# Patient Record
Sex: Male | Born: 1995 | Race: White | Hispanic: No | Marital: Married | Smoking: Former smoker
Health system: Southern US, Community
[De-identification: ages and names within clinical notes are randomized; demographics above are authoritative.]

## PROBLEM LIST (undated history)

## (undated) DIAGNOSIS — F319 Bipolar disorder, unspecified: Secondary | ICD-10-CM

## (undated) DIAGNOSIS — F259 Schizoaffective disorder, unspecified: Secondary | ICD-10-CM

## (undated) HISTORY — PX: WISDOM TOOTH EXTRACTION: SHX21

---
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
## 2015-09-26 DIAGNOSIS — Z79899 Other long term (current) drug therapy: Secondary | ICD-10-CM

## 2015-09-26 HISTORY — DX: Other long term (current) drug therapy: Z79.899

## 2015-10-23 DIAGNOSIS — M2550 Pain in unspecified joint: Secondary | ICD-10-CM

## 2015-10-23 HISTORY — DX: Pain in unspecified joint: M25.50

## 2015-10-26 DIAGNOSIS — R7689 Other specified abnormal immunological findings in serum: Secondary | ICD-10-CM

## 2015-10-26 DIAGNOSIS — R768 Other specified abnormal immunological findings in serum: Secondary | ICD-10-CM

## 2015-10-26 DIAGNOSIS — E559 Vitamin D deficiency, unspecified: Secondary | ICD-10-CM

## 2015-10-26 HISTORY — DX: Other specified abnormal immunological findings in serum: R76.89

## 2015-10-26 HISTORY — DX: Vitamin D deficiency, unspecified: E55.9

## 2015-10-26 HISTORY — DX: Other specified abnormal immunological findings in serum: R76.8

## 2015-11-17 DIAGNOSIS — R5382 Chronic fatigue, unspecified: Secondary | ICD-10-CM

## 2015-11-17 HISTORY — DX: Chronic fatigue, unspecified: R53.82

## 2015-12-07 DIAGNOSIS — J302 Other seasonal allergic rhinitis: Secondary | ICD-10-CM | POA: Insufficient documentation

## 2016-09-17 DIAGNOSIS — G4710 Hypersomnia, unspecified: Secondary | ICD-10-CM

## 2016-09-17 DIAGNOSIS — R109 Unspecified abdominal pain: Secondary | ICD-10-CM

## 2016-09-17 HISTORY — DX: Hypersomnia, unspecified: G47.10

## 2016-09-17 HISTORY — DX: Unspecified abdominal pain: R10.9

## 2017-03-19 HISTORY — PX: CHOLECYSTECTOMY: SHX55

## 2017-05-23 DIAGNOSIS — F419 Anxiety disorder, unspecified: Secondary | ICD-10-CM

## 2017-05-23 DIAGNOSIS — F429 Obsessive-compulsive disorder, unspecified: Secondary | ICD-10-CM

## 2017-05-23 HISTORY — DX: Obsessive-compulsive disorder, unspecified: F42.9

## 2017-05-23 HISTORY — DX: Anxiety disorder, unspecified: F41.9

## 2017-06-25 ENCOUNTER — Other Ambulatory Visit: Payer: Self-pay

## 2017-06-25 ENCOUNTER — Emergency Department (INDEPENDENT_AMBULATORY_CARE_PROVIDER_SITE_OTHER)
Admission: EM | Admit: 2017-06-25 | Discharge: 2017-06-25 | Disposition: A | Discharge status: Home / Self Care | Payer: PRIVATE HEALTH INSURANCE | Source: Home / Self Care | Attending: Family Medicine | Admitting: Family Medicine

## 2017-06-25 ENCOUNTER — Encounter: Payer: Self-pay | Admitting: *Deleted

## 2017-06-25 DIAGNOSIS — K219 Gastro-esophageal reflux disease without esophagitis: Secondary | ICD-10-CM | POA: Diagnosis not present

## 2017-06-25 HISTORY — DX: Schizoaffective disorder, unspecified: F25.9

## 2017-06-25 HISTORY — DX: Bipolar disorder, unspecified: F31.9

## 2017-06-25 LAB — POCT URINALYSIS DIP (MANUAL ENTRY)
Bilirubin, UA: NEGATIVE
GLUCOSE UA: NEGATIVE mg/dL
Ketones, POC UA: NEGATIVE mg/dL
Leukocytes, UA: NEGATIVE
NITRITE UA: NEGATIVE
PROTEIN UA: NEGATIVE mg/dL
RBC UA: NEGATIVE
Spec Grav, UA: 1.01 (ref 1.010–1.025)
UROBILINOGEN UA: 0.2 U/dL
pH, UA: 6 (ref 5.0–8.0)

## 2017-06-25 MED ORDER — OMEPRAZOLE 40 MG PO CPDR: DELAYED_RELEASE_CAPSULE | ORAL | 1 refills | Status: DC

## 2017-06-25 NOTE — ED Triage Notes (Signed)
Pt c/o nausea mostly after eating, lower abd cramping, cough and sinus drainage x 1 wk. He also reports feeling lethargy. He as vomited 2 x and had minimal diarrhea.

## 2017-06-25 NOTE — Discharge Instructions (Addendum)
If symptoms become significantly worse during the night or over the weekend, proceed to the local emergency room.  

## 2017-06-25 NOTE — ED Provider Notes (Signed)
[facility]    CSN: [id number] Arrival date & time: 06/25/17  1903     History   Chief Complaint Chief Complaint  Patient presents with  . Nausea  . Abdominal Cramping    HPI Joshua Tyler is a 22 y.o. male.   Patient complains of approximately one week history of intermittent nausea and upper abdominal discomfort after eating.  He has had two episodes of vomiting.  His symptoms are not triggered by specific foods.  His bowel movements have generally been normal except for an episode of loose stools.  He states that he is recovering from a mild cold.  He states that he has been fatigued, but denies fevers, chills, and sweats.  No urinary symptoms. He admits that he has had frequent indigestion for several weeks prior to his present symptoms.  He also reports that he has been under increased stress recently  The history is provided by the patient.    Past Medical History:  Diagnosis Date  . Bipolar 1 disorder (HCC)   . Schizoaffective disorder (HCC)     There are no active problems to display for this patient.   Past Surgical History:  Procedure Laterality Date  . WISDOM TOOTH EXTRACTION         Home Medications    Prior to Admission medications   Medication Sig Start Date End Date Taking? Authorizing Provider  ARIPiprazole (ABILIFY) 5 MG tablet Take by mouth. 08/20/16  Yes [provider]  buPROPion (WELLBUTRIN XL) 150 MG 24 hr tablet Take by mouth. 06/03/17 06/03/18 Yes [provider]  docusate sodium (COLACE) 100 MG capsule Take 100 mg by mouth 2 (two) times daily.   Yes [provider]  lamoTRIgine (LAMICTAL) 200 MG tablet Take by mouth. 04/12/17 04/12/18 Yes [provider]  loratadine (CLARITIN) 10 MG tablet Take by mouth. 11/20/16 11/20/17 Yes [provider]  LORazepam (ATIVAN) 0.5 MG tablet Take 1 tab each morning and night, and 1 additional tablet as needed for anxiety / panic 10/07/15  Yes [provider]  propranolol (INDERAL) 20 MG tablet Take by mouth. 08/07/16  Yes [provider]  cloZAPine (CLOZARIL) 100 MG tablet TAKE 4 TABLETS (400 MG TOTAL) BY MOUTH NIGHTLY. 05/20/17   [provider]  cloZAPine (CLOZARIL) 25 MG tablet TAKE 3 TABLETS (75 MG TOTAL) BY MOUTH NIGHTLY. TAKE ALONG WITH 3 X 100MG  TABLETS 04/19/17   [provider]  omeprazole (PRILOSEC) 40 MG capsule Take one cap by mouth once daily about 20 minutes before food 06/25/17   Lattie Haw, MD    Family History Family History  Problem Relation Age of Onset  . Heart disease Father     Social History Social History   Tobacco Use  . Smoking status: Former Smoker    Last attempt to quit: 06/25/2012    Years since quitting: 5.0  . Smokeless tobacco: Never Used  Substance Use Topics  . Alcohol use: Never    Frequency: Never  . Drug use: Never     Allergies   Patient has no known allergies.   Review of Systems Review of Systems  Constitutional: Positive for appetite change and fatigue. Negative for chills, diaphoresis, fever and unexpected weight change.  HENT: Positive for congestion.   Eyes: Negative.   Respiratory: Positive for cough.   Cardiovascular: Negative for chest pain.  Gastrointestinal: Positive for abdominal pain, constipation, diarrhea, nausea and vomiting. Negative for abdominal distention, anal bleeding and blood in stool.  Genitourinary: Negative.   Musculoskeletal: Negative.   Skin: Negative.   Neurological: Negative for dizziness and headaches.     Physical Exam Triage Vital Signs ED Triage Vitals  Enc Vitals Group     BP 06/25/17 2007 109/75     Pulse Rate 06/25/17 2007 90     Resp 06/25/17 2007 18     Temp 06/25/17 2007 98.5 F (36.9 C)     Temp Source 06/25/17 2007 Oral     SpO2 06/25/17 2007 98 %     Weight 06/25/17 2008 207 lb (93.9 kg)     Height 06/25/17 2008 6' (1.829 m)     Head Circumference --      Peak Flow --      Pain Score 06/25/17 2007 5       Pain Loc --      Pain Edu? --      Excl. in GC? --    No data found.  Updated Vital Signs BP 109/75 (BP Location: Right Arm)   Pulse 90   Temp 98.5 F (36.9 C) (Oral)   Resp 18   Ht 6' (1.829 m)   Wt 207 lb (93.9 kg)   SpO2 98%   BMI 28.07 kg/m   Visual Acuity Right Eye Distance:   Left Eye Distance:   Bilateral Distance:    Right Eye Near:   Left Eye Near:    Bilateral Near:     Physical Exam Nursing notes and Vital Signs reviewed. Appearance:  Patient appears stated age, and in no acute distress.    Eyes:  Pupils are equal, round, and reactive to light and accomodation.  Extraocular movement is intact.  Conjunctivae are not inflamed. Ears:  Tympanic membranes normal  Nose:  Normal.  Pharynx:  Normal; moist mucous membranes  Neck:  Supple.  No adenopathy or thyromegaly Lungs:  Clear to auscultation.  Breath sounds are equal.  Moving air well. Heart:  Regular rate and rhythm without murmurs, rubs, or gallops.  Abdomen:  Mild tenderness subxiphoid and epigastric area without masses or hepatosplenomegaly.  Bowel sounds are present.   There is mild bilateral flank tenderness.  Extremities:  No edema.  Skin:  No rash present.     UC Treatments / Results  Labs (all labs ordered are listed, but only abnormal results are displayed) Labs Reviewed  POCT URINALYSIS DIP (MANUAL ENTRY) negative    EKG None Radiology No results found.  Procedures Procedures (including critical care time)  Medications Ordered in UC Medications - No data to display   Initial Impression / Assessment and Plan / UC Course  I have reviewed the triage vital signs and the nursing notes.  Pertinent labs & imaging results that were available during my care of the patient were reviewed by me and considered in my medical decision making (see chart for details).    Begin omeprazole 40mg  daily Followup with PCP; may need GI referral if not improving. If symptoms become significantly  worse during the night or over the weekend, proceed to the local emergency room.     Final Clinical Impressions(s) / UC Diagnoses   Final diagnoses:  Gastroesophageal reflux disease, esophagitis presence not specified    ED Discharge Orders        Ordered    omeprazole (PRILOSEC) 40 MG capsule     06/25/17 2112         Lattie HawBeese, Stephen A, MD 06/26/17 1216

## 2017-07-19 ENCOUNTER — Encounter: Payer: Self-pay | Admitting: Physician Assistant

## 2017-07-19 ENCOUNTER — Ambulatory Visit (INDEPENDENT_AMBULATORY_CARE_PROVIDER_SITE_OTHER): Payer: PRIVATE HEALTH INSURANCE | Admitting: Physician Assistant

## 2017-07-19 VITALS — BP 104/69 | HR 86 | Ht 72.0 in | Wt 193.0 lb

## 2017-07-19 DIAGNOSIS — K2100 Gastro-esophageal reflux disease with esophagitis, without bleeding: Secondary | ICD-10-CM

## 2017-07-19 DIAGNOSIS — Z79899 Other long term (current) drug therapy: Secondary | ICD-10-CM | POA: Diagnosis not present

## 2017-07-19 DIAGNOSIS — K2900 Acute gastritis without bleeding: Secondary | ICD-10-CM | POA: Diagnosis not present

## 2017-07-19 MED ORDER — PANTOPRAZOLE SODIUM 40 MG PO TBEC: 40.0000 mg | DELAYED_RELEASE_TABLET | Freq: Two times a day (BID) | ORAL | 1 refills | Status: DC

## 2017-07-19 MED ORDER — SUCRALFATE 1 G PO TABS: 1.0000 g | ORAL_TABLET | Freq: Four times a day (QID) | ORAL | 1 refills | Status: DC

## 2017-07-19 NOTE — Progress Notes (Signed)
   Subjective:    Patient ID: Joshua Tyler, male    DOB: February 16, 1996, 22 y.o.   MRN: [id number]  HPI  Pt is a 22 yo male who presents to the clinic to establish care.   .. Active Ambulatory Problems    Diagnosis Date Noted  . Bipolar 1 disorder (HCC) 07/21/2017  . Schizoaffective disorder (HCC) 07/21/2017  . Gastroesophageal reflux disease with esophagitis 07/21/2017   Resolved Ambulatory Problems    Diagnosis Date Noted  . No Resolved Ambulatory Problems   Past Medical History:  Diagnosis Date  . Bipolar 1 disorder (HCC)   . Schizoaffective disorder (HCC)    Pt is concerned about 2 months of nausea, acid reflux and epigastric pain. He has also lost 20lbs in the last month because he can't stand to eat. Eating seems to make worse. He has vomited once. He denies any bowel changes. No melena or hematochezia. He reports being tested via stool for h.pylori that was negative. He has not started any new medications. He does eat a lot of hot sauce before this excessively. On protonix with little relief.       Review of Systems  All other systems reviewed and are negative.      Objective:   Physical Exam  Constitutional: He is oriented to person, place, and time. He appears well-developed and well-nourished.  HENT:  Head: Normocephalic and atraumatic.  Cardiovascular: Normal rate and regular rhythm.  Pulmonary/Chest: Effort normal and breath sounds normal.  Abdominal: Soft. Bowel sounds are normal. He exhibits no distension and no mass. There is tenderness. There is no rebound and no guarding.  Epigastric tenderness.   Neurological: He is alert and oriented to person, place, and time.  Psychiatric: He has a normal mood and affect. His behavior is normal.          Assessment & Plan:  Marland KitchenMarland KitchenAhmari was seen today for establish care and gastroesophageal reflux.  Diagnoses and all orders for this visit:  Gastroesophageal reflux disease with esophagitis -     CBC with  Differential/Platelet -     COMPLETE METABOLIC PANEL WITH GFR -     Lipase -     sucralfate (CARAFATE) 1 g tablet; Take 1 tablet (1 g total) by mouth 4 (four) times daily. -     pantoprazole (PROTONIX) 40 MG tablet; Take 1 tablet (40 mg total) by mouth 2 (two) times daily before a meal.  Medication management -     CBC with Differential/Platelet  Acute gastritis without hemorrhage, unspecified gastritis type -     CBC with Differential/Platelet -     COMPLETE METABOLIC PANEL WITH GFR -     Lipase -     sucralfate (CARAFATE) 1 g tablet; Take 1 tablet (1 g total) by mouth 4 (four) times daily. -     pantoprazole (PROTONIX) 40 MG tablet; Take 1 tablet (40 mg total) by mouth 2 (two) times daily before a meal.  labs ordered.  Seems like GERD with some gastritis vs PUD. Unclear cause but could be hot sauce. Discussed BRAT diet. GI cocktail given today. Increase protonix bid and added carafate. Follow up in 4 weeks.

## 2017-07-19 NOTE — Patient Instructions (Signed)
Peptic Ulcer °A peptic ulcer is a painful sore in the lining of your esophagus, stomach, or the first part of your small intestine. You may have pain in the area between your chest and your belly button. The most common causes of an ulcer are: °· An infection. °· Using certain pain medicines too often or too much. ° °Follow these instructions at home: °· Avoid alcohol. °· Avoid caffeine. °· Do not use any tobacco products. These include cigarettes, chewing tobacco, and e-cigarettes. If you need help quitting, ask your doctor. °· Take over-the-counter and prescription medicines only as told by your doctor. Do not stop or change your medicines unless you talk with your doctor about it first. °· Keep all follow-up visits as told by your doctor. This is important. °Contact a doctor if: °· You do not get better in 7 days after you start treatment. °· You keep having an upset stomach (indigestion) or heartburn. °Get help right away if: °· You have sudden, sharp pain in your belly (abdomen). °· You have lasting belly pain. °· You have bloody poop (stool) or black, tarry poop. °· You throw up (vomit) blood. It may look like coffee grounds. °· You feel light-headed or feel like you may pass out (faint). °· You get weak. °· You get sweaty or feel sticky and cold to the touch (clammy). °This information is not intended to replace advice given to you by your health care provider. Make sure you discuss any questions you have with your health care provider. °Document Released: 05/30/2009 Document Revised: 07/20/2015 Document Reviewed: 12/04/2014 °Elsevier Interactive Patient Education © 2018 Elsevier Inc[employer] ° °

## 2017-07-21 ENCOUNTER — Encounter: Payer: Self-pay | Admitting: Physician Assistant

## 2017-07-21 DIAGNOSIS — K2100 Gastro-esophageal reflux disease with esophagitis, without bleeding: Secondary | ICD-10-CM | POA: Insufficient documentation

## 2017-07-21 DIAGNOSIS — F259 Schizoaffective disorder, unspecified: Secondary | ICD-10-CM | POA: Insufficient documentation

## 2017-07-21 DIAGNOSIS — F319 Bipolar disorder, unspecified: Secondary | ICD-10-CM | POA: Insufficient documentation

## 2017-07-21 HISTORY — DX: Schizoaffective disorder, unspecified: F25.9

## 2017-07-21 HISTORY — DX: Gastro-esophageal reflux disease with esophagitis, without bleeding: K21.00

## 2017-07-23 LAB — CBC WITH DIFFERENTIAL/PLATELET
BASOS ABS: 22 {cells}/uL (ref 0–200)
BASOS PCT: 0.3 %
EOS PCT: 0 %
Eosinophils Absolute: 0 cells/uL — ABNORMAL LOW (ref 15–500)
HCT: 44.1 % (ref 38.5–50.0)
Hemoglobin: 15.5 g/dL (ref 13.2–17.1)
Lymphs Abs: 1368 cells/uL (ref 850–3900)
MCH: 30.3 pg (ref 27.0–33.0)
MCHC: 35.1 g/dL (ref 32.0–36.0)
MCV: 86.3 fL (ref 80.0–100.0)
MONOS PCT: 6.7 %
MPV: 10.7 fL (ref 7.5–12.5)
NEUTROS ABS: 5328 {cells}/uL (ref 1500–7800)
Neutrophils Relative %: 74 %
PLATELETS: 225 10*3/uL (ref 140–400)
RBC: 5.11 10*6/uL (ref 4.20–5.80)
RDW: 12.9 % (ref 11.0–15.0)
TOTAL LYMPHOCYTE: 19 %
WBC mixed population: 482 cells/uL (ref 200–950)
WBC: 7.2 10*3/uL (ref 3.8–10.8)

## 2017-07-23 LAB — COMPLETE METABOLIC PANEL WITH GFR
AG Ratio: 2 (calc) (ref 1.0–2.5)
ALKALINE PHOSPHATASE (APISO): 93 U/L (ref 40–115)
ALT: 10 U/L (ref 9–46)
AST: 12 U/L (ref 10–40)
Albumin: 4.7 g/dL (ref 3.6–5.1)
BUN: 13 mg/dL (ref 7–25)
CO2: 30 mmol/L (ref 20–32)
CREATININE: 1.25 mg/dL (ref 0.60–1.35)
Calcium: 9.7 mg/dL (ref 8.6–10.3)
Chloride: 104 mmol/L (ref 98–110)
GFR, Est African American: 94 mL/min/{1.73_m2} (ref >=60)
GFR, Est Non African American: 81 mL/min/{1.73_m2} (ref >=60)
Globulin: 2.3 g/dL (calc) (ref 1.9–3.7)
Glucose, Bld: 89 mg/dL (ref 65–99)
Potassium: 4.1 mmol/L (ref 3.5–5.3)
Sodium: 139 mmol/L (ref 135–146)
Total Bilirubin: 0.9 mg/dL (ref 0.2–1.2)
Total Protein: 7 g/dL (ref 6.1–8.1)

## 2017-07-23 LAB — LIPASE: LIPASE: 29 U/L (ref 7–60)

## 2017-07-23 NOTE — Progress Notes (Signed)
Call pt: labs look great. Nothing concerning.

## 2017-07-23 NOTE — Progress Notes (Signed)
We could but then he would have to be off PPI/carafate for at least 2 weeks. I suggest giving medication therapy a try and then consider endoscopy if symptoms persist.

## 2017-07-25 ENCOUNTER — Telehealth: Payer: Self-pay

## 2017-07-25 DIAGNOSIS — R1013 Epigastric pain: Secondary | ICD-10-CM

## 2017-07-25 NOTE — Telephone Encounter (Signed)
Joshua Tyler's wife called and reports the medication treatment is not helping. He still is very nauseated and has a lot of gas. She states he is still not able to eat. She is requesting to go ahead with the ultra sound. I didn't see anything about an ultra sound in the note. She is also wanting him to have IV fluids.

## 2017-07-25 NOTE — Telephone Encounter (Signed)
Ok for abdominal ultrasound for epigastric pain, nausea.

## 2017-07-26 NOTE — Telephone Encounter (Signed)
US ordered

## 2017-07-30 ENCOUNTER — Encounter: Payer: Self-pay | Admitting: Physician Assistant

## 2017-07-30 ENCOUNTER — Ambulatory Visit (INDEPENDENT_AMBULATORY_CARE_PROVIDER_SITE_OTHER): Payer: PRIVATE HEALTH INSURANCE

## 2017-07-30 DIAGNOSIS — R161 Splenomegaly, not elsewhere classified: Secondary | ICD-10-CM | POA: Diagnosis not present

## 2017-07-30 DIAGNOSIS — R1013 Epigastric pain: Secondary | ICD-10-CM

## 2017-07-30 HISTORY — DX: Splenomegaly, not elsewhere classified: R16.1

## 2017-07-30 NOTE — Progress Notes (Signed)
Call pt: spleen is enlarged. Certainly needs to be discussed with GI. There is a lot of gas. When is appt with GI? No sign of anything going on with gallbladder.

## 2017-07-31 ENCOUNTER — Telehealth: Payer: Self-pay | Admitting: Emergency Medicine

## 2017-07-31 ENCOUNTER — Ambulatory Visit (INDEPENDENT_AMBULATORY_CARE_PROVIDER_SITE_OTHER): Payer: PRIVATE HEALTH INSURANCE | Admitting: Physician Assistant

## 2017-07-31 ENCOUNTER — Encounter: Payer: Self-pay | Admitting: Physician Assistant

## 2017-07-31 ENCOUNTER — Ambulatory Visit (INDEPENDENT_AMBULATORY_CARE_PROVIDER_SITE_OTHER): Payer: PRIVATE HEALTH INSURANCE

## 2017-07-31 VITALS — BP 124/82 | HR 105 | Temp 98.3°F | Resp 14 | Wt 189.0 lb

## 2017-07-31 DIAGNOSIS — R000 Tachycardia, unspecified: Secondary | ICD-10-CM | POA: Diagnosis not present

## 2017-07-31 DIAGNOSIS — R1114 Bilious vomiting: Secondary | ICD-10-CM

## 2017-07-31 DIAGNOSIS — R509 Fever, unspecified: Secondary | ICD-10-CM

## 2017-07-31 DIAGNOSIS — R161 Splenomegaly, not elsewhere classified: Secondary | ICD-10-CM | POA: Diagnosis not present

## 2017-07-31 DIAGNOSIS — R05 Cough: Secondary | ICD-10-CM | POA: Diagnosis not present

## 2017-07-31 DIAGNOSIS — E860 Dehydration: Secondary | ICD-10-CM | POA: Diagnosis not present

## 2017-07-31 DIAGNOSIS — J989 Respiratory disorder, unspecified: Secondary | ICD-10-CM

## 2017-07-31 LAB — POCT RAPID STREP A (OFFICE): RAPID STREP A SCREEN: NEGATIVE

## 2017-07-31 MED ORDER — ONDANSETRON HCL 4 MG/2ML IJ SOLN
4.0000 mg | Freq: Once | INTRAMUSCULAR | Status: AC
  Administered 2017-07-31: 4 mg via INTRAVENOUS

## 2017-07-31 MED ORDER — BENZONATATE 200 MG PO CAPS: 200.0000 mg | ORAL_CAPSULE | Freq: Three times a day (TID) | ORAL | 0 refills | Status: DC | PRN

## 2017-07-31 MED ORDER — SODIUM CHLORIDE 0.9 % IV SOLN: 4.0000 mg | Freq: Once | INTRAVENOUS | Status: DC

## 2017-07-31 MED ORDER — LIDOCAINE VISCOUS HCL 2 % MT SOLN: 10.0000 mL | OROMUCOSAL | 0 refills | Status: AC | PRN

## 2017-07-31 MED ORDER — ONDANSETRON 4 MG PO TBDP: 4.0000 mg | ORAL_TABLET | Freq: Three times a day (TID) | ORAL | 0 refills | Status: DC | PRN

## 2017-07-31 NOTE — Patient Instructions (Addendum)
For sore throat: - Tylenol  every 8 hours as needed for throat pain. Alternate with Ibuprofen  every 6 hours as needed for pain/fever - gargle and spit lidocaine every 3 hours as needed - Cepacol throat lozenges - Warm salt water gargles  For cough: - Mucinex with at least 8 oz. of water to make cough more productive - Delsym to help suppress cough - Tessalon pearls: 1 capsule every 8 hours as needed  For elevated heart rate: - push fluids (at least 1.5L of clear liquids per day) -  For nausea: - zofran dissolved under the tongue every 8 hours as needed for nausea  - take small sips every 15 minutes - bland diet

## 2017-07-31 NOTE — Progress Notes (Signed)
HPI:                                                                Joshua Tyler is a 22 y.o. male who presents to [facility]: Primary Care Sports Medicine today for  sore throat and cough  URI   This is a new problem. The current episode started in the past 7 days. The problem has been unchanged. Maximum temperature: tactile fever. The fever has been present for less than 1 day. Associated symptoms include abdominal pain (chronic epigastric), congestion, coughing, ear pain, nausea, a sore throat and vomiting. Pertinent negatives include no chest pain, neck pain, rash or wheezing. He has tried acetaminophen for the symptoms.     Past Medical History:  Diagnosis Date  . Bipolar 1 disorder (HCC)   . Schizoaffective disorder [facility])    Past Surgical History:  Procedure Laterality Date  . WISDOM TOOTH EXTRACTION     Social History   Tobacco Use  . Smoking status: Former Smoker    Last attempt to quit: 06/25/2012    Years since quitting: 5.1  . Smokeless tobacco: Never Used  Substance Use Topics  . Alcohol use: Never    Frequency: Never   family history includes Cancer in his paternal grandfather; Heart attack in his paternal grandfather; Heart disease in his father.    ROS: negative except as noted in the HPI  Medications: Current Outpatient Medications  Medication Sig Dispense Refill  . ARIPiprazole (ABILIFY) 5 MG tablet Take by mouth.    . benzonatate (TESSALON) 200 MG capsule Take 1 capsule (200 mg total) by mouth 3 (three) times daily as needed for cough. 45 capsule 0  . buPROPion (WELLBUTRIN XL) 150 MG 24 hr tablet Take 450 mg by mouth.     . cloZAPine (CLOZARIL) 100 MG tablet TAKE 4 TABLETS (400 MG TOTAL) BY MOUTH NIGHTLY.  3  . cloZAPine (CLOZARIL) 25 MG tablet TAKE 1 TABLET BY MOUTH NIGHTLY. TAKE ALONG WITH 4  TABLETS  3  . docusate sodium (COLACE) 100 MG capsule Take 100 mg by mouth 2 (two) times daily.    [name] lamoTRIgine (LAMICTAL) 200 MG tablet  Take by mouth.    . lidocaine (XYLOCAINE) 2 % solution Use as directed 10 mLs in the mouth or throat every 3 (three) hours as needed for up to 7 days for mouth pain. 100 mL 0  . loratadine (CLARITIN) 10 MG tablet Take by mouth.    [name] LORazepam (ATIVAN) 0.5 MG tablet Take 1 tab each morning and night, and 1 additional tablet as needed for anxiety / panic    . ondansetron (ZOFRAN-ODT) 4 MG disintegrating tablet Take 1 tablet (4 mg total) by mouth every 8 (eight) hours as needed for nausea or vomiting. 16 tablet 0  . pantoprazole (PROTONIX) 40 MG tablet Take 1 tablet (40 mg total) by mouth 2 (two) times daily before a meal. 60 tablet 1  . propranolol (INDERAL) 20 MG tablet Take by mouth.    . sucralfate (CARAFATE) 1 g tablet Take 1 tablet (1 g total) by mouth 4 (four) times daily. 120 tablet 1   Current Facility-Administered Medications  Medication Dose Route Frequency Provider Last Rate Last Dose  . ondansetron (ZOFRAN) 4 mg in  sodium chloride 0.9 % 50 mL IVPB  4 mg Intravenous Once Carlis Stable, PA-C       No Known Allergies     Objective:  BP 124/82   Pulse (!) 125   Temp 98.3 F (36.8 C) (Oral)   Wt 189 lb (85.7 kg)   SpO2 97%   BMI 25.63 kg/m  Gen:  alert, ill-appearing, not toxic-appearing, no distress, appropriate for age HEENT: head normocephalic without obvious abnormality, conjunctiva and cornea clear, TM's pearly gray and semi-transparent, oropharynx with erythema, no exudates, uvula midline, neck supple, no cervical adenopathy, trachea midline Pulm: Normal work of breathing, normal phonation, clear to auscultation bilaterally, no wheezes, rales or rhonchi CV: tachycardic on arrival at 125, decreased to 105 on recheck, regular rhythm, s1 and s2 distinct, no murmurs, clicks or rubs  Neuro: alert and oriented x 3, no tremor MSK: extremities atraumatic, normal gait and station Skin: intact, no rashes on exposed skin, no jaundice, no cyanosis   No results found  for this or any previous visit (from the past 72 hour(s)). US Abdomen Complete  Result Date: 07/30/2017 CLINICAL DATA:  22 year old male with epigastric pain for 2 months. Initial encounter. EXAM: ABDOMEN ULTRASOUND COMPLETE COMPARISON:  None. FINDINGS: Gallbladder: No gallstones or wall thickening visualized. No sonographic Murphy sign noted by sonographer. Common bile duct: Diameter: 3.1 mm Liver: No focal lesion identified. Within normal limits in parenchymal echogenicity. Portal vein is patent on color Doppler imaging with normal direction of blood flow towards the liver. IVC: Not well visualized secondary to bowel gas. Pancreas: Not well visualized secondary to bowel gas. Spleen: Mild splenomegaly with spleen spanning over 13 cm and volume of 683 cc. Right Kidney: Length: 10.1 cm. Echogenicity within normal limits. No mass or hydronephrosis visualized. Left Kidney: Length: 12.6 cm. Echogenicity within normal limits. No mass or hydronephrosis visualized. Abdominal aorta: Suboptimally evaluated secondary to habitus and bowel gas. Portions visualized unremarkable. No aneurysm visualized. Other findings: None. IMPRESSION: Mild splenomegaly with spleen spanning over 13 cm and volume of 683 cc. Inferior vena cava, pancreas and portions of aorta poorly delineated secondary to bowel gas. Remainder of exam within normal limits. Specifically, no gallstones or findings to suggest gallbladder inflammation. Electronically Signed   By: Lacy Duverney M.D.   On: 07/30/2017 13:50    Procedure note: Inserted #20 gauge, 1-inch BD Insyte angiocatheter in the right antecubital vein on attempt. Good blood return. Flushed with normal saline.     Assessment and Plan: 22 y.o. male with   Febrile respiratory illness - Plan: Epstein-Barr virus VCA antibody panel, CBC with Differential/Platelet, POCT rapid strep A, lidocaine (XYLOCAINE) 2 % solution, benzonatate (TESSALON) 200 MG capsule, DG Chest 2 View  Splenomegaly -  Plan: Epstein-Barr virus VCA antibody panel, CBC with Differential/Platelet  Bilious vomiting with nausea - Plan: ondansetron (ZOFRAN-ODT) 4 MG disintegrating tablet, ondansetron (ZOFRAN) injection 4 mg, DISCONTINUED: ondansetron (ZOFRAN) 4 mg in sodium chloride 0.9 % 50 mL IVPB  2 days of tactile fevers, sore throat, cough, n/v. He was tachycardic at 125 bpm on arrival, afebrile, no tachypnea. Tachycardia improved to 105 after 1L of NS. Likely secondary to dehydration.    He is being worked up for chronic epigastric pain and nausea. Abdominal US showed incidental finding of splenomegaly. Infectious mono is on the differential. Rapid strep negative. CBC w/diff and EBV antibodies pending   He had one episode of emesis last night. Tolerating PO fluids. IV zofran given in office today along with 1L  of normal saline. Chest x-ray pending.  Counseled on symptomatic care for acute URI and nausea   Patient education and anticipatory guidance given Patient agrees with treatment plan Follow-up as needed if symptoms worsen or fail to improve  Levonne Hubert PA-C

## 2017-07-31 NOTE — Telephone Encounter (Incomplete)
Patient's wife calling with request from patient's psychiatrist that a monthly CBC be drawn and results faxed to : Attention Dr.Bhargava; fax [phone]. I am printing and faxing results 07/22/17 to get this started.

## 2017-08-01 LAB — CBC WITH DIFFERENTIAL/PLATELET
BASOS PCT: 0.4 %
Basophils Absolute: 20 cells/uL (ref 0–200)
Eosinophils Absolute: 0 cells/uL — ABNORMAL LOW (ref 15–500)
Eosinophils Relative: 0 %
HCT: 39.3 % (ref 38.5–50.0)
Hemoglobin: 13.8 g/dL (ref 13.2–17.1)
Lymphs Abs: 1030 cells/uL (ref 850–3900)
MCH: 30.2 pg (ref 27.0–33.0)
MCHC: 35.1 g/dL (ref 32.0–36.0)
MCV: 86 fL (ref 80.0–100.0)
MONOS PCT: 12.1 %
MPV: 11.1 fL (ref 7.5–12.5)
Neutro Abs: 3432 cells/uL (ref 1500–7800)
Neutrophils Relative %: 67.3 %
PLATELETS: 176 10*3/uL (ref 140–400)
RBC: 4.57 10*6/uL (ref 4.20–5.80)
RDW: 12.8 % (ref 11.0–15.0)
TOTAL LYMPHOCYTE: 20.2 %
WBC mixed population: 617 cells/uL (ref 200–950)
WBC: 5.1 10*3/uL (ref 3.8–10.8)

## 2017-08-01 LAB — EPSTEIN-BARR VIRUS VCA ANTIBODY PANEL
EBV NA IgG: <18 U/mL
EBV VCA IgG: <18 U/mL
EBV VCA IgM: <36 U/mL

## 2017-08-02 NOTE — Progress Notes (Signed)
Negative mono test Normal white blood cell counts

## 2017-08-04 ENCOUNTER — Encounter: Payer: Self-pay | Admitting: Physician Assistant

## 2017-08-06 ENCOUNTER — Other Ambulatory Visit: Payer: Self-pay | Admitting: Physician Assistant

## 2017-08-06 DIAGNOSIS — R1114 Bilious vomiting: Secondary | ICD-10-CM

## 2017-08-09 ENCOUNTER — Ambulatory Visit: Payer: PRIVATE HEALTH INSURANCE | Admitting: Sports Medicine

## 2017-08-16 ENCOUNTER — Encounter: Payer: Self-pay | Admitting: Physician Assistant

## 2017-09-24 ENCOUNTER — Encounter: Payer: Self-pay | Admitting: Physician Assistant

## 2017-09-24 DIAGNOSIS — Z79899 Other long term (current) drug therapy: Secondary | ICD-10-CM

## 2017-09-26 ENCOUNTER — Other Ambulatory Visit: Payer: Self-pay

## 2017-09-26 DIAGNOSIS — Z79899 Other long term (current) drug therapy: Secondary | ICD-10-CM

## 2017-09-26 LAB — CBC WITH DIFFERENTIAL/PLATELET
BASOS PCT: 0.3 %
Basophils Absolute: 19 cells/uL (ref 0–200)
EOS PCT: 0 %
Eosinophils Absolute: 0 cells/uL — ABNORMAL LOW (ref 15–500)
HCT: 45.1 % (ref 38.5–50.0)
HEMOGLOBIN: 15.6 g/dL (ref 13.2–17.1)
Lymphs Abs: 1336 cells/uL (ref 850–3900)
MCH: 30.7 pg (ref 27.0–33.0)
MCHC: 34.6 g/dL (ref 32.0–36.0)
MCV: 88.8 fL (ref 80.0–100.0)
MONOS PCT: 6.4 %
MPV: 10.7 fL (ref 7.5–12.5)
NEUTROS ABS: 4542 {cells}/uL (ref 1500–7800)
Neutrophils Relative %: 72.1 %
PLATELETS: 212 10*3/uL (ref 140–400)
RBC: 5.08 10*6/uL (ref 4.20–5.80)
RDW: 13 % (ref 11.0–15.0)
TOTAL LYMPHOCYTE: 21.2 %
WBC mixed population: 403 cells/uL (ref 200–950)
WBC: 6.3 10*3/uL (ref 3.8–10.8)

## 2017-09-26 NOTE — Progress Notes (Signed)
Please view mychart msg where patient tells us where to fax this. It is a standing order monthly to be faxed.

## 2017-09-30 ENCOUNTER — Encounter: Payer: Self-pay | Admitting: Physician Assistant

## 2017-09-30 ENCOUNTER — Ambulatory Visit (INDEPENDENT_AMBULATORY_CARE_PROVIDER_SITE_OTHER): Payer: PRIVATE HEALTH INSURANCE | Admitting: Physician Assistant

## 2017-09-30 VITALS — BP 102/57 | HR 92 | Temp 98.2°F | Wt 182.0 lb

## 2017-09-30 DIAGNOSIS — R300 Dysuria: Secondary | ICD-10-CM

## 2017-09-30 DIAGNOSIS — Z9049 Acquired absence of other specified parts of digestive tract: Secondary | ICD-10-CM | POA: Diagnosis not present

## 2017-09-30 DIAGNOSIS — J0140 Acute pansinusitis, unspecified: Secondary | ICD-10-CM

## 2017-09-30 HISTORY — DX: Acquired absence of other specified parts of digestive tract: Z90.49

## 2017-09-30 LAB — POCT URINALYSIS DIPSTICK
BILIRUBIN UA: NEGATIVE
Blood, UA: NEGATIVE
GLUCOSE UA: NEGATIVE
KETONES UA: NEGATIVE
LEUKOCYTES UA: NEGATIVE
Nitrite, UA: NEGATIVE
Protein, UA: NEGATIVE
SPEC GRAV UA: 1.015 (ref 1.010–1.025)
Urobilinogen, UA: 0.2 E.U./dL
pH, UA: 7 (ref 5.0–8.0)

## 2017-09-30 MED ORDER — AMOXICILLIN-POT CLAVULANATE 875-125 MG PO TABS: 1.0000 | ORAL_TABLET | Freq: Two times a day (BID) | ORAL | 0 refills | Status: DC

## 2017-09-30 MED ORDER — AZITHROMYCIN 250 MG PO TABS: ORAL_TABLET | ORAL | 0 refills | Status: DC

## 2017-09-30 NOTE — Progress Notes (Signed)
Subjective:    Patient ID: Joshua Tyler, male    DOB: February 23, 1996, 23 y.o.   MRN: [id number]  HPI  Pt is a 22 yo male who presents to the clinic with his wife to follow up on intermittent dysuria and urinary frequency for at least 6 months. He has had symptoms for 2 weeks more constant than normal. No fever, chills, body aches. One sexual partner with wife. His psychiatrist wanted a work up before she changed some of his pysch medications that could be causing symptoms. No penile lesions or discharge. No abdominal or flank pain.   He has also had some persistent sinus pressure and headaches. Hx of sinus infections. Symptoms present for over a week. Tried OTC mucinex D without relief. No fever, chills, body aches.   Recently had gallbladder removed. NO GI symptoms now.   .. Active Ambulatory Problems    Diagnosis Date Noted  . Bipolar 1 disorder (HCC) 07/21/2017  . Schizoaffective disorder (HCC) 07/21/2017  . Gastroesophageal reflux disease with esophagitis 07/21/2017  . Spleen enlarged 07/30/2017  . Status post cholecystectomy 09/30/2017   Resolved Ambulatory Problems    Diagnosis Date Noted  . No Resolved Ambulatory Problems   Past Medical History:  Diagnosis Date  . Bipolar 1 disorder (HCC)   . Schizoaffective disorder (HCC)         Review of Systems  All other systems reviewed and are negative.      Objective:   Physical Exam  Constitutional: He is oriented to person, place, and time. He appears well-developed and well-nourished.  HENT:  Head: Normocephalic and atraumatic.  Right Ear: External ear normal.  Left Ear: External ear normal.  Neck: Normal range of motion. Neck supple.  Cardiovascular: Normal rate and regular rhythm.  Pulmonary/Chest: Effort normal and breath sounds normal.  No CVA tenderness.   Abdominal: Soft. Bowel sounds are normal. He exhibits no distension and no mass. There is no tenderness. There is no rebound and no guarding.  Genitourinary:  Prostate normal. Rectal exam shows guaiac negative stool.  Lymphadenopathy:    He has no cervical adenopathy.  Neurological: He is alert and oriented to person, place, and time.  Psychiatric: He has a normal mood and affect. His behavior is normal.          Assessment & Plan:  Marland KitchenMarland KitchenLes was seen today for urinary tract infection.  Diagnoses and all orders for this visit:  Dysuria -     Urinalysis -     PSA -     POCT urinalysis dipstick -     C. trachomatis/N. gonorrhoeae RNA  Status post cholecystectomy  Acute non-recurrent pansinusitis -     azithromycin (ZITHROMAX) 250 MG tablet; Take 2 tablets now and then one tablet for 4 days. -     amoxicillin-clavulanate (AUGMENTIN) 875-125 MG tablet; Take 1 tablet by mouth 2 (two) times daily.   .. Results for orders placed or performed in visit on 09/30/17  C. trachomatis/N. gonorrhoeae RNA  Result Value Ref Range   C. trachomatis RNA, TMA NOT DETECTED NOT DETECT   N. gonorrhoeae RNA, TMA NOT DETECTED NOT DETECT  POCT urinalysis dipstick  Result Value Ref Range   Color, UA yellow    Clarity, UA clear    Glucose, UA Negative Negative   Bilirubin, UA neg    Ketones, UA neg    Spec Grav, UA 1.015 1.010 - 1.025   Blood, UA neg    pH, UA 7.0 5.0 -  8.0   Protein, UA Negative Negative   Urobilinogen, UA 0.2 0.2 or 1.0 E.U./dL   Nitrite, UA neg    Leukocytes, UA Negative Negative   Appearance clear    Odor none    Will culture no signs of urinary tract infection on exam. Negative for GC/Chlamydia. DRE was normal. Denies any prostate problems.  Will check PsA. CBC was perfect last week. I suspect this is side effect of medication or some chronic bladder inflammation. I would suggest med change first and see if resolves before urological work up.   Treated sinusitis with zpak but patient reported that does not work so changed to augmentin. Continue with symptomatic treatment. HO given.   Doing great after gallbladder removal. NO  GI symptoms.

## 2017-10-01 LAB — C. TRACHOMATIS/N. GONORRHOEAE RNA
C. TRACHOMATIS RNA, TMA: NOT DETECTED
N. gonorrhoeae RNA, TMA: NOT DETECTED

## 2017-10-01 NOTE — Progress Notes (Signed)
Call pt: negative for STI's and urine perfectly clear.

## 2017-10-02 DIAGNOSIS — R300 Dysuria: Secondary | ICD-10-CM | POA: Insufficient documentation

## 2017-10-25 ENCOUNTER — Other Ambulatory Visit: Payer: Self-pay

## 2017-10-25 DIAGNOSIS — Z79899 Other long term (current) drug therapy: Secondary | ICD-10-CM

## 2017-10-25 LAB — CBC WITH DIFFERENTIAL/PLATELET
BASOS ABS: 23 {cells}/uL (ref 0–200)
BASOS PCT: 0.3 %
EOS ABS: 152 {cells}/uL (ref 15–500)
Eosinophils Relative: 2 %
HCT: 42.7 % (ref 38.5–50.0)
HEMOGLOBIN: 15.1 g/dL (ref 13.2–17.1)
LYMPHS ABS: 1657 {cells}/uL (ref 850–3900)
MCH: 30.4 pg (ref 27.0–33.0)
MCHC: 35.4 g/dL (ref 32.0–36.0)
MCV: 85.9 fL (ref 80.0–100.0)
MONOS PCT: 5.3 %
MPV: 10.8 fL (ref 7.5–12.5)
NEUTROS ABS: 5366 {cells}/uL (ref 1500–7800)
Neutrophils Relative %: 70.6 %
PLATELETS: 211 10*3/uL (ref 140–400)
RBC: 4.97 10*6/uL (ref 4.20–5.80)
RDW: 12.9 % (ref 11.0–15.0)
Total Lymphocyte: 21.8 %
WBC: 7.6 10*3/uL (ref 3.8–10.8)
WBCMIX: 403 {cells}/uL (ref 200–950)

## 2017-10-28 ENCOUNTER — Encounter: Payer: Self-pay | Admitting: Physician Assistant

## 2017-10-28 DIAGNOSIS — Z79899 Other long term (current) drug therapy: Secondary | ICD-10-CM

## 2017-10-28 NOTE — Progress Notes (Signed)
Cbc looks good. He needs his labs faxed to [place] specialist in [place]. See chart where the last cbc went. He gets drawn monthly.

## 2017-10-28 NOTE — Telephone Encounter (Signed)
I tried to place one, it looks the same as yours. Unsure.

## 2017-10-28 NOTE — Telephone Encounter (Signed)
Labs ready to be faxed. I tried to make a standing monthy order for CBC downstairs. It did not work. Is there a way to do monthly orders?

## 2017-11-22 ENCOUNTER — Other Ambulatory Visit: Payer: Self-pay

## 2017-11-22 DIAGNOSIS — Z79899 Other long term (current) drug therapy: Secondary | ICD-10-CM

## 2017-11-22 LAB — CBC
HCT: 44.7 % (ref 38.5–50.0)
Hemoglobin: 15.7 g/dL (ref 13.2–17.1)
MCH: 30 pg (ref 27.0–33.0)
MCHC: 35.1 g/dL (ref 32.0–36.0)
MCV: 85.3 fL (ref 80.0–100.0)
MPV: 10.6 fL (ref 7.5–12.5)
PLATELETS: 203 10*3/uL (ref 140–400)
RBC: 5.24 10*6/uL (ref 4.20–5.80)
RDW: 12.7 % (ref 11.0–15.0)
WBC: 8.3 10*3/uL (ref 3.8–10.8)

## 2017-11-24 NOTE — Progress Notes (Signed)
Please fax to psychiatrist in [place] dr. Lorin Picket for monthly cbc.

## 2017-12-05 ENCOUNTER — Other Ambulatory Visit: Payer: Self-pay

## 2017-12-05 ENCOUNTER — Emergency Department (HOSPITAL_BASED_OUTPATIENT_CLINIC_OR_DEPARTMENT_OTHER)
Admission: EM | Admit: 2017-12-05 | Discharge: 2017-12-05 | Disposition: A | Discharge status: Home / Self Care | Payer: 59 | Attending: Emergency Medicine | Admitting: Emergency Medicine

## 2017-12-05 ENCOUNTER — Encounter (HOSPITAL_BASED_OUTPATIENT_CLINIC_OR_DEPARTMENT_OTHER): Payer: Self-pay

## 2017-12-05 DIAGNOSIS — F319 Bipolar disorder, unspecified: Secondary | ICD-10-CM | POA: Diagnosis not present

## 2017-12-05 DIAGNOSIS — Z79899 Other long term (current) drug therapy: Secondary | ICD-10-CM | POA: Diagnosis not present

## 2017-12-05 DIAGNOSIS — Z23 Encounter for immunization: Secondary | ICD-10-CM | POA: Diagnosis not present

## 2017-12-05 DIAGNOSIS — Y998 Other external cause status: Secondary | ICD-10-CM | POA: Diagnosis not present

## 2017-12-05 DIAGNOSIS — Z87891 Personal history of nicotine dependence: Secondary | ICD-10-CM | POA: Insufficient documentation

## 2017-12-05 DIAGNOSIS — Y9389 Activity, other specified: Secondary | ICD-10-CM | POA: Diagnosis not present

## 2017-12-05 DIAGNOSIS — T23232A Burn of second degree of multiple left fingers (nail), not including thumb, initial encounter: Secondary | ICD-10-CM | POA: Insufficient documentation

## 2017-12-05 DIAGNOSIS — Y92000 Kitchen of unspecified non-institutional (private) residence as  the place of occurrence of the external cause: Secondary | ICD-10-CM | POA: Insufficient documentation

## 2017-12-05 DIAGNOSIS — F259 Schizoaffective disorder, unspecified: Secondary | ICD-10-CM | POA: Insufficient documentation

## 2017-12-05 DIAGNOSIS — T23062A Burn of unspecified degree of back of left hand, initial encounter: Secondary | ICD-10-CM | POA: Diagnosis present

## 2017-12-05 DIAGNOSIS — X153XXA Contact with hot saucepan or skillet, initial encounter: Secondary | ICD-10-CM | POA: Diagnosis not present

## 2017-12-05 MED ORDER — SILVER SULFADIAZINE 1 % EX CREA
TOPICAL_CREAM | Freq: Once | CUTANEOUS | Status: AC
  Administered 2017-12-05: 23:00:00 via TOPICAL
  Filled 2017-12-05: qty 85

## 2017-12-05 MED ORDER — OXYCODONE HCL 5 MG PO TABS
5.0000 mg | ORAL_TABLET | Freq: Once | ORAL | Status: AC
Start: 2017-12-05 — End: 2017-12-05
  Administered 2017-12-05: 5 mg via ORAL
  Filled 2017-12-05: qty 1

## 2017-12-05 MED ORDER — TETANUS-DIPHTH-ACELL PERTUSSIS 5-2.5-18.5 LF-MCG/0.5 IM SUSP
0.5000 mL | Freq: Once | INTRAMUSCULAR | Status: AC
  Administered 2017-12-05: 0.5 mL via INTRAMUSCULAR
  Filled 2017-12-05: qty 0.5
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
## 2017-12-17 MED ORDER — CLONAZEPAM 0.5 MG PO TABS
0.25 | ORAL_TABLET | ORAL | Status: DC
Start: ? — End: 2017-12-17

## 2017-12-17 MED ORDER — PROPRANOLOL HCL 20 MG PO TABS
20.00 | ORAL_TABLET | ORAL | Status: DC
Start: 2017-12-17 — End: 2017-12-17

## 2017-12-17 MED ORDER — AQUADEKS PO CHEW
1.00 | CHEWABLE_TABLET | ORAL | Status: DC
Start: 2017-12-18 — End: 2017-12-17

## 2017-12-17 MED ORDER — ALUM & MAG HYDROXIDE-SIMETH 400-400-40 MG/5ML PO SUSP
30.00 | ORAL | Status: DC
Start: ? — End: 2017-12-17

## 2017-12-17 MED ORDER — CHOLESTYRAMINE 4 G PO PACK
1.00 | PACK | ORAL | Status: DC
Start: ? — End: 2017-12-17

## 2017-12-17 MED ORDER — ACETAMINOPHEN 325 MG PO TABS
650.00 | ORAL_TABLET | ORAL | Status: DC
Start: ? — End: 2017-12-17

## 2017-12-17 MED ORDER — SIMETHICONE 80 MG PO CHEW
80.00 | CHEWABLE_TABLET | ORAL | Status: DC
Start: ? — End: 2017-12-17

## 2017-12-17 MED ORDER — HYDROXYZINE HCL 25 MG PO TABS
25.00 | ORAL_TABLET | ORAL | Status: DC
Start: ? — End: 2017-12-17

## 2017-12-17 MED ORDER — ARIPIPRAZOLE 2 MG PO TABS
2.00 | ORAL_TABLET | ORAL | Status: DC
Start: 2017-12-17 — End: 2017-12-17

## 2017-12-17 MED ORDER — LORATADINE 10 MG PO TABS
10.00 | ORAL_TABLET | ORAL | Status: DC
Start: 2017-12-18 — End: 2017-12-17

## 2017-12-17 MED ORDER — LAMOTRIGINE 200 MG PO TABS
200.00 | ORAL_TABLET | ORAL | Status: DC
Start: 2017-12-18 — End: 2017-12-17

## 2017-12-17 MED ORDER — ONDANSETRON 4 MG PO TBDP
4.00 | ORAL_TABLET | ORAL | Status: DC
Start: ? — End: 2017-12-17

## 2017-12-17 MED ORDER — ARIPIPRAZOLE 5 MG PO TABS
5.00 | ORAL_TABLET | ORAL | Status: DC
Start: 2017-12-18 — End: 2017-12-17

## 2017-12-17 MED ORDER — BUPROPION HCL ER (XL) 300 MG PO TB24
300.00 | ORAL_TABLET | ORAL | Status: DC
Start: 2017-12-18 — End: 2017-12-17

## 2017-12-17 MED ORDER — MELATONIN 3 MG PO TABS
1.50 | ORAL_TABLET | ORAL | Status: DC
Start: 2017-12-17 — End: 2017-12-17

## 2017-12-17 MED ORDER — CLONAZEPAM 0.5 MG PO TABS
0.50 | ORAL_TABLET | ORAL | Status: DC
Start: 2017-12-17 — End: 2017-12-17

## 2017-12-17 MED ORDER — GENERIC EXTERNAL MEDICATION
425.00 | Status: DC
Start: 2017-12-17 — End: 2017-12-17

## 2017-12-17 MED ORDER — POLYETHYLENE GLYCOL 3350 17 G PO PACK
17.00 | PACK | ORAL | Status: DC
Start: ? — End: 2017-12-17

## 2018-01-28 ENCOUNTER — Other Ambulatory Visit: Payer: Self-pay

## 2018-01-28 DIAGNOSIS — Z79899 Other long term (current) drug therapy: Secondary | ICD-10-CM

## 2018-01-28 LAB — CBC
HEMATOCRIT: 44.7 % (ref 38.5–50.0)
Hemoglobin: 15.4 g/dL (ref 13.2–17.1)
MCH: 30.1 pg (ref 27.0–33.0)
MCHC: 34.5 g/dL (ref 32.0–36.0)
MCV: 87.3 fL (ref 80.0–100.0)
MPV: 10.9 fL (ref 7.5–12.5)
PLATELETS: 183 10*3/uL (ref 140–400)
RBC: 5.12 10*6/uL (ref 4.20–5.80)
RDW: 12.9 % (ref 11.0–15.0)
WBC: 7.1 10*3/uL (ref 3.8–10.8)

## 2018-01-28 NOTE — Progress Notes (Signed)
Needs to be faxed to psychiatry at [facility] [address]. Stable labs.

## 2018-02-02 ENCOUNTER — Encounter: Payer: Self-pay | Admitting: Physician Assistant

## 2018-02-02 DIAGNOSIS — Z79899 Other long term (current) drug therapy: Secondary | ICD-10-CM

## 2018-02-05 ENCOUNTER — Other Ambulatory Visit: Payer: Self-pay | Admitting: Physician Assistant

## 2018-02-05 LAB — CBC WITH DIFFERENTIAL/PLATELET
BASOS ABS: 34 {cells}/uL (ref 0–200)
Basophils Relative: 0.4 %
Eosinophils Absolute: 0 cells/uL — ABNORMAL LOW (ref 15–500)
Eosinophils Relative: 0 %
HCT: 45.5 % (ref 38.5–50.0)
Hemoglobin: 16.1 g/dL (ref 13.2–17.1)
Lymphs Abs: 1896 cells/uL (ref 850–3900)
MCH: 30.9 pg (ref 27.0–33.0)
MCHC: 35.4 g/dL (ref 32.0–36.0)
MCV: 87.3 fL (ref 80.0–100.0)
MONOS PCT: 6 %
MPV: 11 fL (ref 7.5–12.5)
NEUTROS PCT: 71.3 %
Neutro Abs: 6061 cells/uL (ref 1500–7800)
PLATELETS: 222 10*3/uL (ref 140–400)
RBC: 5.21 10*6/uL (ref 4.20–5.80)
RDW: 13 % (ref 11.0–15.0)
Total Lymphocyte: 22.3 %
WBC mixed population: 510 cells/uL (ref 200–950)
WBC: 8.5 10*3/uL (ref 3.8–10.8)

## 2018-02-06 NOTE — Progress Notes (Signed)
Please send to psych in care note.

## 2018-03-03 ENCOUNTER — Other Ambulatory Visit: Payer: Self-pay

## 2018-03-03 DIAGNOSIS — Z79899 Other long term (current) drug therapy: Secondary | ICD-10-CM

## 2018-03-03 LAB — CBC WITH DIFFERENTIAL/PLATELET
Absolute Monocytes: 493 cells/uL (ref 200–950)
BASOS PCT: 0.3 %
Basophils Absolute: 28 cells/uL (ref 0–200)
EOS PCT: 1.5 %
Eosinophils Absolute: 140 cells/uL (ref 15–500)
HCT: 44.8 % (ref 38.5–50.0)
Hemoglobin: 15.4 g/dL (ref 13.2–17.1)
Lymphs Abs: 1646 cells/uL (ref 850–3900)
MCH: 30.2 pg (ref 27.0–33.0)
MCHC: 34.4 g/dL (ref 32.0–36.0)
MCV: 87.8 fL (ref 80.0–100.0)
MONOS PCT: 5.3 %
MPV: 10.5 fL (ref 7.5–12.5)
NEUTROS ABS: 6994 {cells}/uL (ref 1500–7800)
Neutrophils Relative %: 75.2 %
PLATELETS: 212 10*3/uL (ref 140–400)
RBC: 5.1 10*6/uL (ref 4.20–5.80)
RDW: 12.7 % (ref 11.0–15.0)
Total Lymphocyte: 17.7 %
WBC: 9.3 10*3/uL (ref 3.8–10.8)

## 2018-03-04 NOTE — Progress Notes (Signed)
Please fax to psych on file.

## 2018-03-31 ENCOUNTER — Other Ambulatory Visit: Payer: Self-pay

## 2018-03-31 DIAGNOSIS — Z79899 Other long term (current) drug therapy: Secondary | ICD-10-CM

## 2018-03-31 LAB — CBC WITH DIFFERENTIAL/PLATELET
Absolute Monocytes: 634 cells/uL (ref 200–950)
BASOS ABS: 24 {cells}/uL (ref 0–200)
Basophils Relative: 0.2 %
EOS ABS: 37 {cells}/uL (ref 15–500)
EOS PCT: 0.3 %
HEMATOCRIT: 45.2 % (ref 38.5–50.0)
Hemoglobin: 16.1 g/dL (ref 13.2–17.1)
LYMPHS ABS: 1403 {cells}/uL (ref 850–3900)
MCH: 31.1 pg (ref 27.0–33.0)
MCHC: 35.6 g/dL (ref 32.0–36.0)
MCV: 87.4 fL (ref 80.0–100.0)
MONOS PCT: 5.2 %
MPV: 10.7 fL (ref 7.5–12.5)
NEUTROS PCT: 82.8 %
Neutro Abs: 10102 cells/uL — ABNORMAL HIGH (ref 1500–7800)
Platelets: 198 10*3/uL (ref 140–400)
RBC: 5.17 10*6/uL (ref 4.20–5.80)
RDW: 13 % (ref 11.0–15.0)
Total Lymphocyte: 11.5 %
WBC: 12.2 10*3/uL — ABNORMAL HIGH (ref 3.8–10.8)

## 2018-04-01 NOTE — Progress Notes (Signed)
Fax to psych on file.

## 2018-04-25 DIAGNOSIS — R1084 Generalized abdominal pain: Secondary | ICD-10-CM | POA: Insufficient documentation

## 2018-04-25 HISTORY — DX: Generalized abdominal pain: R10.84

## 2018-04-28 ENCOUNTER — Other Ambulatory Visit: Payer: Self-pay

## 2018-04-28 DIAGNOSIS — Z79899 Other long term (current) drug therapy: Secondary | ICD-10-CM

## 2018-04-28 LAB — CBC WITH DIFFERENTIAL/PLATELET
Absolute Monocytes: 432 cells/uL (ref 200–950)
Basophils Absolute: 16 cells/uL (ref 0–200)
Basophils Relative: 0.2 %
EOS PCT: 0 %
Eosinophils Absolute: 0 cells/uL — ABNORMAL LOW (ref 15–500)
HCT: 45.1 % (ref 38.5–50.0)
Hemoglobin: 16.2 g/dL (ref 13.2–17.1)
Lymphs Abs: 1552 cells/uL (ref 850–3900)
MCH: 31.4 pg (ref 27.0–33.0)
MCHC: 35.9 g/dL (ref 32.0–36.0)
MCV: 87.4 fL (ref 80.0–100.0)
MPV: 10.8 fL (ref 7.5–12.5)
Monocytes Relative: 5.4 %
NEUTROS PCT: 75 %
Neutro Abs: 6000 cells/uL (ref 1500–7800)
PLATELETS: 208 10*3/uL (ref 140–400)
RBC: 5.16 10*6/uL (ref 4.20–5.80)
RDW: 12.7 % (ref 11.0–15.0)
TOTAL LYMPHOCYTE: 19.4 %
WBC: 8 10*3/uL (ref 3.8–10.8)

## 2018-04-29 NOTE — Progress Notes (Signed)
Send to psych on file.

## 2018-05-01 ENCOUNTER — Ambulatory Visit (INDEPENDENT_AMBULATORY_CARE_PROVIDER_SITE_OTHER): Payer: 59

## 2018-05-01 ENCOUNTER — Ambulatory Visit (INDEPENDENT_AMBULATORY_CARE_PROVIDER_SITE_OTHER): Payer: 59 | Admitting: Sports Medicine

## 2018-05-01 DIAGNOSIS — R002 Palpitations: Secondary | ICD-10-CM

## 2018-05-01 DIAGNOSIS — R000 Tachycardia, unspecified: Secondary | ICD-10-CM | POA: Diagnosis not present

## 2018-05-01 DIAGNOSIS — R079 Chest pain, unspecified: Secondary | ICD-10-CM

## 2018-05-01 HISTORY — DX: Palpitations: R00.2

## 2018-05-01 MED ORDER — PREDNISONE 50 MG PO TABS: 50.0000 mg | ORAL_TABLET | Freq: Every day | ORAL | 0 refills | Status: DC

## 2018-05-01 NOTE — Assessment & Plan Note (Signed)
With current upper respiratory symptoms. He is also reporting chest pain with exertion, better with rest. CBC, CMP, TSH, d-dimer, enzymes. ECG today. Chest x-ray. Echocardiogram and referral for Holter monitoring. Referral for treadmill stress test. Adding 5 days of prednisone. Return to see me in 1 to 2 weeks to go over results of everything.

## 2018-05-01 NOTE — Progress Notes (Signed)
Subjective:    CC: Feeling sick  HPI: Joshua Tyler is a pleasant 23 year old male, for the past couple of days he has felt a bit ill, muscle aches, body aches, possibly low-grade fevers and chills.  Tachycardic.  On further questioning he has been having palpitations, and chest pain with exertion for several months now.  Symptoms are moderate, persistent, mild cough, no GI symptoms, no skin rash.  I reviewed the past medical history, family history, social history, surgical history, and allergies today and no changes were needed.  Please see the problem list section below in epic for further details.  Past Medical History: Past Medical History:  Diagnosis Date  . Bipolar 1 disorder (HCC)   . Schizoaffective disorder [facility])    Past Surgical History: Past Surgical History:  Procedure Laterality Date  . WISDOM TOOTH EXTRACTION     Social History: Social History   Socioeconomic History  . Marital status: Married    Spouse name: Not on file  . Number of children: Not on file  . Years of education: Not on file  . Highest education level: Not on file  Occupational History  . Not on file  Social Needs  . Financial resource strain: Not on file  . Food insecurity:    Worry: Not on file    Inability: Not on file  . Transportation needs:    Medical: Not on file    Non-medical: Not on file  Tobacco Use  . Smoking status: Former Smoker    Last attempt to quit: 06/25/2012    Years since quitting: 5.8  . Smokeless tobacco: Never Used  Substance and Sexual Activity  . Alcohol use: Never    Frequency: Never  . Drug use: Not Currently    Types: Marijuana  . Sexual activity: Yes    Birth control/protection: Condom, Pill  Lifestyle  . Physical activity:    Days per week: Not on file    Minutes per session: Not on file  . Stress: Not on file  Relationships  . Social connections:    Talks on phone: Not on file    Gets together: Not on file    Attends religious service: Not on file   Active member of club or organization: Not on file    Attends meetings of clubs or organizations: Not on file    Relationship status: Not on file  Other Topics Concern  . Not on file  Social History Narrative  . Not on file   Family History: Family History  Problem Relation Age of Onset  . Heart disease Father   . Heart attack Paternal Grandfather   . Cancer Paternal Grandfather        colon and prostate   Allergies: No Known Allergies Medications: See med rec.  Review of Systems: No fevers, chills, night sweats, weight loss, chest pain, or shortness of breath.   Objective:    General: Well Developed, well nourished, and in no acute distress.  Neuro: Alert and oriented x3, extra-ocular muscles intact, sensation grossly intact.  HEENT: Normocephalic, atraumatic, pupils equal round reactive to light, neck supple, no masses, no lymphadenopathy, thyroid nonpalpable.  Oropharynx, nasopharynx, ear canals unremarkable. Skin: Warm and dry, no rashes. Cardiac: Regular rate and rhythm, no murmurs rubs or gallops, no lower extremity edema.  Respiratory: Clear to auscultation bilaterally. Not using accessory muscles, speaking in full sentences.  Twelve-lead ECG personally reviewed, normal sinus rhythm at 90 bpm.  Impression and Recommendations:    Palpitations With current  upper respiratory symptoms. He is also reporting chest pain with exertion, better with rest. CBC, CMP, TSH, d-dimer, enzymes. ECG today. Chest x-ray. Echocardiogram and referral for Holter monitoring. Referral for treadmill stress test. Adding 5 days of prednisone. Return to see me in 1 to 2 weeks to go over results of everything. ___________________________________________ Ihor Austin. Benjamin Stain, M.D., ABFM., CAQSM. Primary Care and Sports Medicine [facility]  Adjunct Professor of Family Medicine  [employer]

## 2018-05-02 ENCOUNTER — Telehealth: Payer: Self-pay

## 2018-05-02 ENCOUNTER — Telehealth: Payer: Self-pay | Admitting: Sports Medicine

## 2018-05-02 DIAGNOSIS — J989 Respiratory disorder, unspecified: Secondary | ICD-10-CM

## 2018-05-02 DIAGNOSIS — R509 Fever, unspecified: Principal | ICD-10-CM

## 2018-05-02 NOTE — Telephone Encounter (Signed)
I evaluated/treated him for an upper respiratory infection (he was too late for tamiflu), not sure what the issue is.  Treatment is supportive care and patience, we imaged his chest and this was clear.  He ALSO reported chest pain with exertion from several months prior so I was working that up as well.  Fevers plus palpitations can imply myocarditis which is why I am getting the echocardiogram.   If she feels like she knows better and would like to cancel all the cardiac testing that is fine as well.

## 2018-05-02 NOTE — Telephone Encounter (Signed)
Mother of patient called and had questions regarding his visit yesterday with Dr.T. Pt came in because he thought he had flu like symptoms.Mother states his original reason for appt was not addressed and he is still not feeling well. Mother is calling also because he is concerned if all the test are necessary for his cardiac work up since EKG and bloodwork were normal.

## 2018-05-02 NOTE — Telephone Encounter (Signed)
Dr Maple Mirza called and left a  Message to ask if Dr Benjamin Stain would add some tests on to the labs. She wanted to add Troponin and CRP.   I do see the Troponin was already ordered.

## 2018-05-02 NOTE — Telephone Encounter (Signed)
Added test

## 2018-05-02 NOTE — Telephone Encounter (Signed)
Happy to add a CRP.  Please call the lab to add to the blood that's already there.

## 2018-05-03 LAB — COMPREHENSIVE METABOLIC PANEL
AG Ratio: 2.4 (calc) (ref 1.0–2.5)
ALT: 12 U/L (ref 9–46)
AST: 11 U/L (ref 10–40)
Alkaline phosphatase (APISO): 99 U/L (ref 36–130)
CO2: 28 mmol/L (ref 20–32)
Chloride: 104 mmol/L (ref 98–110)
Creat: 1.07 mg/dL (ref 0.60–1.35)
Globulin: 2.1 g/dL (calc) (ref 1.9–3.7)
Glucose, Bld: 99 mg/dL (ref 65–99)
Potassium: 4.2 mmol/L (ref 3.5–5.3)
Sodium: 140 mmol/L (ref 135–146)
Total Bilirubin: 1.6 mg/dL — ABNORMAL HIGH (ref 0.2–1.2)
Total Protein: 7.1 g/dL (ref 6.1–8.1)

## 2018-05-03 LAB — COMPREHENSIVE METABOLIC PANEL WITH GFR
Albumin: 5 g/dL (ref 3.6–5.1)
BUN: 15 mg/dL (ref 7–25)
Calcium: 9.7 mg/dL (ref 8.6–10.3)

## 2018-05-03 LAB — CBC
HCT: 47 % (ref 38.5–50.0)
Hemoglobin: 16.7 g/dL (ref 13.2–17.1)
MCH: 31 pg (ref 27.0–33.0)
MCHC: 35.5 g/dL (ref 32.0–36.0)
MCV: 87.4 fL (ref 80.0–100.0)
MPV: 10.2 fL (ref 7.5–12.5)
Platelets: 201 10*3/uL (ref 140–400)
RBC: 5.38 10*6/uL (ref 4.20–5.80)
RDW: 12.8 % (ref 11.0–15.0)
WBC: 10.5 Thousand/uL (ref 3.8–10.8)

## 2018-05-03 LAB — BRAIN NATRIURETIC PEPTIDE: Brain Natriuretic Peptide: <4 pg/mL (ref <100)

## 2018-05-03 LAB — TROPONIN I: Troponin I: <0.01 ng/mL (ref <=0.0)

## 2018-05-03 LAB — CK TOTAL AND CKMB (NOT AT ARMC)
CK, MB: <0.7 ng/mL (ref 0–5.0)
Total CK: 50 U/L (ref 44–196)

## 2018-05-03 LAB — D-DIMER, QUANTITATIVE: D-Dimer, Quant: <0.19 mcg/mL FEU (ref <0.50)

## 2018-05-03 LAB — C-REACTIVE PROTEIN: CRP: 3.8 mg/L (ref <8.0)

## 2018-05-03 LAB — TSH: TSH: 1.63 mIU/L (ref 0.40–4.50)

## 2018-05-06 NOTE — Telephone Encounter (Signed)
Haved attempted to reach patient. Message was left in regards to how he has been feeling and to discuss reasons to keep cardiac appt.

## 2018-05-07 NOTE — Telephone Encounter (Signed)
Pt's mother has returned your call to her and would like a call back.  [phone]

## 2018-05-09 ENCOUNTER — Ambulatory Visit: Payer: 59 | Admitting: Physician Assistant

## 2018-06-02 ENCOUNTER — Other Ambulatory Visit: Payer: Self-pay

## 2018-06-02 ENCOUNTER — Encounter: Payer: Self-pay | Admitting: Physician Assistant

## 2018-06-02 ENCOUNTER — Ambulatory Visit (INDEPENDENT_AMBULATORY_CARE_PROVIDER_SITE_OTHER): Payer: 59 | Admitting: Physician Assistant

## 2018-06-02 VITALS — BP 118/83 | HR 98 | Temp 98.5°F | Resp 16 | Ht 71.0 in | Wt 179.0 lb

## 2018-06-02 DIAGNOSIS — J329 Chronic sinusitis, unspecified: Secondary | ICD-10-CM | POA: Diagnosis not present

## 2018-06-02 DIAGNOSIS — J40 Bronchitis, not specified as acute or chronic: Secondary | ICD-10-CM | POA: Diagnosis not present

## 2018-06-02 MED ORDER — HYDROCOD POLST-CPM POLST ER 10-8 MG/5ML PO SUER: 5.0000 mL | Freq: Two times a day (BID) | ORAL | 0 refills | Status: DC | PRN

## 2018-06-02 MED ORDER — PREDNISONE 50 MG PO TABS: ORAL_TABLET | ORAL | 0 refills | Status: DC

## 2018-06-02 MED ORDER — AZITHROMYCIN 250 MG PO TABS: ORAL_TABLET | ORAL | 0 refills | Status: DC

## 2018-06-02 NOTE — Progress Notes (Signed)
   Subjective:    Patient ID: Joshua Tyler, male    DOB: April 11, 1995, 23 y.o.   MRN: [id number]  HPI  Patient is a 23 year old male who presents to the clinic not feeling well for the last 3 weeks.  He was seen in office on 05/01/2018 by my partner who did a full work-up on palpitations that he was having.  Chest x-ray, EKG, CBC, CMP, TSH, d-dimer, troponin were all negative.  He was referred for an echo and stress test.  He did not have these done.  He was given 5 days of prednisone which seemed to help his symptoms but never got 100 percent. His CP have resolved. Few days later he started feeling more achy again.  He does have a cough, chest tightness, few episodes of loose stools. He has a lot of sinus pressure and congestion. He reports "his head feels full".  He has had no known travel.  He has had contact with someone from [place] but they are asymptomatic.  No hx of asthma.   .. Active Ambulatory Problems    Diagnosis Date Noted  . Bipolar 1 disorder (HCC) 07/21/2017  . Schizoaffective disorder (HCC) 07/21/2017  . Gastroesophageal reflux disease with esophagitis 07/21/2017  . Spleen enlarged 07/30/2017  . Status post cholecystectomy 09/30/2017  . Palpitations 05/01/2018   Resolved Ambulatory Problems    Diagnosis Date Noted  . Dysuria 10/02/2017   No Additional Past Medical History      Review of Systems See HPI.     Objective:   Physical Exam Vitals signs reviewed.  Constitutional:      Appearance: He is well-developed.  HENT:     Head: Normocephalic.     Comments: Tenderness over maxillary sinuses to palpation.     Right Ear: Tympanic membrane normal.     Left Ear: Tympanic membrane normal.     Nose: Congestion present.     Mouth/Throat:     Pharynx: Oropharynx is clear. Posterior oropharyngeal erythema present. No oropharyngeal exudate.  Eyes:     Conjunctiva/sclera: Conjunctivae normal.  Neck:     Musculoskeletal: Normal range of motion and neck supple.   Cardiovascular:     Rate and Rhythm: Normal rate and regular rhythm.     Heart sounds: Normal heart sounds.  Lymphadenopathy:     Cervical: Cervical adenopathy present.  Neurological:     General: No focal deficit present.     Mental Status: He is alert.  Psychiatric:        Mood and Affect: Mood normal.           Assessment & Plan:  Marland KitchenMarland KitchenFynn was seen today for sore throat, cough and diarrhea.  Diagnoses and all orders for this visit:  Sinobronchitis -     azithromycin (ZITHROMAX) 250 MG tablet; Take 2 tablets now and then one tablet for 4 days. -     predniSONE (DELTASONE) 50 MG tablet; Take one tablet for 5 days. -     chlorpheniramine-HYDROcodone (TUSSIONEX) 10-8 MG/5ML SUER; Take 5 mLs by mouth every 12 (twelve) hours as needed.   No known travel. No sick contacts. Low risk for covid-19.   Zpak, prednisone, cough syrup given. CXR done 2/13 and normal. Lungs sounded great. Follow up as needed or if symptoms not improving.

## 2018-06-04 ENCOUNTER — Encounter (INDEPENDENT_AMBULATORY_CARE_PROVIDER_SITE_OTHER): Payer: 59 | Admitting: Physician Assistant

## 2018-06-04 DIAGNOSIS — R05 Cough: Secondary | ICD-10-CM | POA: Diagnosis not present

## 2018-06-04 DIAGNOSIS — R059 Cough, unspecified: Secondary | ICD-10-CM

## 2018-06-04 MED ORDER — ALBUTEROL SULFATE HFA 108 (90 BASE) MCG/ACT IN AERS: 2.0000 | INHALATION_SPRAY | Freq: Four times a day (QID) | RESPIRATORY_TRACT | 0 refills | Status: DC | PRN

## 2018-06-04 NOTE — Telephone Encounter (Signed)
Pt was seen on 3/16 for sinobronchitis. Treated with cough syrup and zpak. He calls back to office with cough and SOB. Added albuterol. Update on Friday or call if symptoms worsening. He did have contact with friends who traveled. If not improving consider covid testing.

## 2018-06-06 ENCOUNTER — Ambulatory Visit (INDEPENDENT_AMBULATORY_CARE_PROVIDER_SITE_OTHER): Payer: 59 | Admitting: Physician Assistant

## 2018-06-06 ENCOUNTER — Other Ambulatory Visit: Payer: Self-pay

## 2018-06-06 ENCOUNTER — Encounter: Payer: Self-pay | Admitting: Physician Assistant

## 2018-06-06 VITALS — BP 128/70 | HR 110 | Temp 98.1°F | Ht 71.0 in

## 2018-06-06 DIAGNOSIS — R05 Cough: Secondary | ICD-10-CM | POA: Diagnosis not present

## 2018-06-06 DIAGNOSIS — R0602 Shortness of breath: Secondary | ICD-10-CM

## 2018-06-06 DIAGNOSIS — R509 Fever, unspecified: Secondary | ICD-10-CM

## 2018-06-06 DIAGNOSIS — R059 Cough, unspecified: Secondary | ICD-10-CM

## 2018-06-06 NOTE — Patient Instructions (Signed)
Treat with albuterol, tylenol, rest hydration and self quarantine.

## 2018-06-09 ENCOUNTER — Encounter: Payer: Self-pay | Admitting: Physician Assistant

## 2018-06-09 NOTE — Progress Notes (Signed)
   Subjective:    Patient ID: Joshua Tyler, male    DOB: [birth year], 23 y.o.   MRN: [id number]  HPI Pt is a 23 yo male who has not felt well since 04/30/18. He has been seen twice in the clinic. The first time a full cardiac work up was done but he never got echo or heart monitor. He was seen again and given zpak, prednisone, cough syrup for sinobronchitis. He continued to have cough and worsening SOb. He was sent albuterol which has helped him the most. CXR was done 2/13 and normal. His cough is productive. He is very fatigued. He did have a contact with friend who lives in [place], [place]. He has not been able to contact him but does not believe he is sick. No other sick contacts in the home.   .. Active Ambulatory Problems    Diagnosis Date Noted  . Bipolar 1 disorder (HCC) 07/21/2017  . Schizoaffective disorder (HCC) 07/21/2017  . Gastroesophageal reflux disease with esophagitis 07/21/2017  . Spleen enlarged 07/30/2017  . Status post cholecystectomy 09/30/2017  . Palpitations 05/01/2018   Resolved Ambulatory Problems    Diagnosis Date Noted  . Dysuria 10/02/2017   No Additional Past Medical History      Review of Systems See HPI.     Objective:   Physical Exam Vitals signs reviewed.  Constitutional:      Appearance: Normal appearance.  HENT:     Head: Normocephalic and atraumatic.     Right Ear: Tympanic membrane normal.     Left Ear: Tympanic membrane normal.     Nose: Congestion present.     Mouth/Throat:     Mouth: Mucous membranes are moist.  Eyes:     Conjunctiva/sclera: Conjunctivae normal.  Cardiovascular:     Rate and Rhythm: Tachycardia present.     Pulses: Normal pulses.     Heart sounds: Normal heart sounds.  Pulmonary:     Effort: Pulmonary effort is normal.     Breath sounds: Normal breath sounds.  Lymphadenopathy:     Cervical: No cervical adenopathy.  Skin:    General: Skin is warm.  Neurological:     General: No focal deficit  present.     Mental Status: He is alert and oriented to person, place, and time.           Assessment & Plan:  Marland KitchenMarland KitchenStanwood was seen today for cough.  Diagnoses and all orders for this visit:  Fever, unspecified fever cause -     SARS Coronavirus with CoV-2 ([facility])  Cough -     SARS Coronavirus with CoV-2 ([facility])  Shortness of breath -     SARS Coronavirus with CoV-2 ([facility])   Pt was COVID tested today due to high risk exposure city person contact and fever, SOB, Cough that is not resolving. Pulse ox is reassuring. Pt lungs sounded good.  Pt was masked the entire visit. Nurse and provider masked with proper PPE equipment. Swab and paperwork obtained. Pt was escorted about back entrance. Pt was put on quarantine until test results comes back. Letter given to patient. Continue to use tylenol, albuterol. Concern with particular patient is his 1 month of exposure to others.  Follow up as needed.   [name].Spent 30 minutes with patient and greater than 50 percent of visit spent counseling patient regarding treatment plan.

## 2018-06-10 ENCOUNTER — Ambulatory Visit (INDEPENDENT_AMBULATORY_CARE_PROVIDER_SITE_OTHER): Payer: 59

## 2018-06-10 ENCOUNTER — Other Ambulatory Visit: Payer: Self-pay

## 2018-06-10 ENCOUNTER — Telehealth: Payer: Self-pay | Admitting: Physician Assistant

## 2018-06-10 ENCOUNTER — Other Ambulatory Visit: Payer: Self-pay | Admitting: Physician Assistant

## 2018-06-10 DIAGNOSIS — R05 Cough: Secondary | ICD-10-CM

## 2018-06-10 DIAGNOSIS — Z79899 Other long term (current) drug therapy: Secondary | ICD-10-CM

## 2018-06-10 LAB — SARS CORONAVIRUS WITH COV-2 RNA, QUAL REAL TIME RT-PCR
Overall Result:: NOT DETECTED
PAN-SARS RNA: NEGATIVE
SARS Cov2 RNA: NEGATIVE

## 2018-06-10 LAB — CBC WITH DIFFERENTIAL/PLATELET
Absolute Monocytes: 451 cells/uL (ref 200–950)
Basophils Absolute: 41 cells/uL (ref 0–200)
Basophils Relative: 0.5 %
Eosinophils Absolute: 0 cells/uL — ABNORMAL LOW (ref 15–500)
Eosinophils Relative: 0 %
HCT: 46.7 % (ref 38.5–50.0)
Hemoglobin: 16.6 g/dL (ref 13.2–17.1)
Lymphs Abs: 1927 cells/uL (ref 850–3900)
MCH: 31.5 pg (ref 27.0–33.0)
MCHC: 35.5 g/dL (ref 32.0–36.0)
MCV: 88.6 fL (ref 80.0–100.0)
MPV: 11.1 fL (ref 7.5–12.5)
Monocytes Relative: 5.5 %
NEUTROS PCT: 70.5 %
Neutro Abs: 5781 cells/uL (ref 1500–7800)
Platelets: 220 10*3/uL (ref 140–400)
RBC: 5.27 10*6/uL (ref 4.20–5.80)
RDW: 12.9 % (ref 11.0–15.0)
Total Lymphocyte: 23.5 %
WBC: 8.2 10*3/uL (ref 3.8–10.8)

## 2018-06-10 MED ORDER — BENZONATATE 200 MG PO CAPS: 200.0000 mg | ORAL_CAPSULE | Freq: Two times a day (BID) | ORAL | 0 refills | Status: DC | PRN

## 2018-06-10 MED ORDER — BUDESONIDE-FORMOTEROL FUMARATE 160-4.5 MCG/ACT IN AERO: 2.0000 | INHALATION_SPRAY | Freq: Two times a day (BID) | RESPIRATORY_TRACT | 0 refills | Status: DC

## 2018-06-10 NOTE — Addendum Note (Signed)
Addended byJomarie Longs on: 06/10/2018 11:22 AM   Modules accepted: Orders

## 2018-06-10 NOTE — Progress Notes (Signed)
Call pt: Covid testing was negative. How do you feel today? If you are fever free for at least 24 hours you do not have to be quarantine any more but still encourage social distancing because the virus is out there and with your extended illness do not need to contract that.

## 2018-06-10 NOTE — Telephone Encounter (Signed)
Call pt: Normal CXR. Will send some tessalon pearls for cough. Continue to use albuterol for cough and SOB up to every 4 hours. I am hesitant to give another round of prednisone as you have had 2 rounds since feb. I am going to give you a daily inhaler I want you to use for 2-3 weeks until cough has resolved. Take 2 puffs twice a day of symbicort.

## 2018-06-10 NOTE — Addendum Note (Signed)
Addended by: Jomarie Longs on: 06/10/2018 02:39 PM   Modules accepted: Orders

## 2018-06-10 NOTE — Telephone Encounter (Signed)
Request monthly cbc.

## 2018-06-11 MED ORDER — DOXYCYCLINE HYCLATE 100 MG PO TABS: 100.0000 mg | ORAL_TABLET | Freq: Two times a day (BID) | ORAL | 0 refills | Status: DC

## 2018-06-11 NOTE — Telephone Encounter (Signed)
Call pt: let him know WBC looks great. THEN we fax monthly to psychiatrist. Can we please do. In care coordination I believe.

## 2018-06-11 NOTE — Addendum Note (Signed)
Addended by: Jomarie Longs on: 06/11/2018 12:37 PM   Modules accepted: Orders

## 2018-06-27 NOTE — Telephone Encounter (Signed)
Spent 5 minutes to review CXR and develop treatment plan. Tandy Gaw PA-C

## 2018-07-02 ENCOUNTER — Encounter: Payer: Self-pay | Admitting: Family Medicine

## 2018-07-02 ENCOUNTER — Ambulatory Visit (INDEPENDENT_AMBULATORY_CARE_PROVIDER_SITE_OTHER): Payer: 59 | Admitting: Family Medicine

## 2018-07-02 VITALS — BP 110/77 | HR 90 | Temp 97.5°F | Ht 71.0 in | Wt 179.0 lb

## 2018-07-02 DIAGNOSIS — N50819 Testicular pain, unspecified: Secondary | ICD-10-CM

## 2018-07-02 DIAGNOSIS — R059 Cough, unspecified: Secondary | ICD-10-CM

## 2018-07-02 DIAGNOSIS — R112 Nausea with vomiting, unspecified: Secondary | ICD-10-CM | POA: Diagnosis not present

## 2018-07-02 DIAGNOSIS — R300 Dysuria: Secondary | ICD-10-CM | POA: Diagnosis not present

## 2018-07-02 DIAGNOSIS — R05 Cough: Secondary | ICD-10-CM

## 2018-07-02 LAB — POCT URINALYSIS DIPSTICK
Bilirubin, UA: NEGATIVE
Blood, UA: NEGATIVE
Glucose, UA: NEGATIVE
Ketones, UA: NEGATIVE
Leukocytes, UA: NEGATIVE
Nitrite, UA: NEGATIVE
Protein, UA: NEGATIVE
Spec Grav, UA: <=1.005 — AB (ref 1.010–1.025)
Urobilinogen, UA: 0.2 E.U./dL
pH, UA: 6.5 (ref 5.0–8.0)

## 2018-07-02 NOTE — Progress Notes (Signed)
Acute Office Visit  Subjective:    Patient ID: Joshua Tyler, male    DOB: February 04, 1996, 23 y.o.   MRN: [id number]  Chief Complaint  Patient presents with  . Urinary Tract Infection    x 1 day  . Testicle Pain    HPI Patient is in today for dysuria.  He says really for about the last month he has had some testicular discomfort.  He denies any known injury or trauma he has not noticed any swelling or lumps.  He is never really had it before he says not really worse on one side versus the other side.  But then this morning he woke up with significant dysuria.  He says it was not with the first morning urination but around 1030 this morning.  He denies any frequency urgency or blood in the urine.  He did notice a little bit of what look like whitish discharge or dead skin at the tip of the penis.  He says it was a little bit itchy and he tried to get it off and did clean it off in the shower.  He denies any fevers chills or sweats.  No back pain.  He is never had a UTI before.  He also wanted to let me know that he actually felt very nauseated and actually vomited once this morning.  Had some occasional nausea but does not usually vomit he says that is new.  He has not vomited since then and has not not had any sick contacts.  He reports that he has been still coughing and having some chest congestion.  He was originally treated for sinobronchitis around March 16 and then came back in on March 20 for fever and shortness of breath.  He was tested for COVID.  And he was treated with Tylenol albuterol and a round of prednisone.  He did notice after 2 rounds of prednisone that his semen looked different.  He says it used to look more clear and now it looks more thick and white.  He still complains of feeling like there is a heaviness in his chest.  Note he did have a chest x-ray on March 24 which was essentially negative.  He denies any significant abdominal pain.  Past Medical History:  Diagnosis Date   . Bipolar 1 disorder (HCC)   . Schizoaffective disorder [facility])     Past Surgical History:  Procedure Laterality Date  . WISDOM TOOTH EXTRACTION      Family History  Problem Relation Age of Onset  . Heart disease Father   . Heart attack Paternal Grandfather   . Cancer Paternal Grandfather        colon and prostate    Social History   Socioeconomic History  . Marital status: Married    Spouse name: Not on file  . Number of children: Not on file  . Years of education: Not on file  . Highest education level: Not on file  Occupational History  . Not on file  Social Needs  . Financial resource strain: Not on file  . Food insecurity:    Worry: Not on file    Inability: Not on file  . Transportation needs:    Medical: Not on file    Non-medical: Not on file  Tobacco Use  . Smoking status: Former Smoker    Last attempt to quit: 06/25/2012    Years since quitting: 6.0  . Smokeless tobacco: Never Used  Substance and Sexual Activity  .  Alcohol use: Never    Frequency: Never  . Drug use: Not Currently    Types: Marijuana  . Sexual activity: Yes    Birth control/protection: Condom, Pill  Lifestyle  . Physical activity:    Days per week: Not on file    Minutes per session: Not on file  . Stress: Not on file  Relationships  . Social connections:    Talks on phone: Not on file    Gets together: Not on file    Attends religious service: Not on file    Active member of club or organization: Not on file    Attends meetings of clubs or organizations: Not on file    Relationship status: Not on file  . Intimate partner violence:    Fear of current or ex partner: Not on file    Emotionally abused: Not on file    Physically abused: Not on file    Forced sexual activity: Not on file  Other Topics Concern  . Not on file  Social History Narrative  . Not on file    Outpatient Medications Prior to Visit  Medication Sig Dispense Refill  . ARIPiprazole (ABILIFY) 5 MG tablet  Take 9 mg by mouth.     . clonazePAM (KLONOPIN) 1 MG tablet Take 1 mg by mouth 2 (two) times daily.    . cloZAPine (CLOZARIL) 100 MG tablet TAKE 4 TABLETS (400 MG TOTAL) BY MOUTH NIGHTLY.  3  . cloZAPine (CLOZARIL) 25 MG tablet TAKE 1 TABLET BY MOUTH NIGHTLY. TAKE ALONG WITH 4  TABLETS  3  . LORazepam (ATIVAN) 0.5 MG tablet Take 1 tab each morning and night, and 1 additional tablet as needed for anxiety / panic    . propranolol (INDERAL) 20 MG tablet Take by mouth.    [name] buPROPion (WELLBUTRIN XL) 150 MG 24 hr tablet Take 450 mg by mouth.     . lamoTRIgine (LAMICTAL) 200 MG tablet Take by mouth.    . loratadine (CLARITIN) 10 MG tablet Take by mouth.    [name] albuterol (PROVENTIL HFA;VENTOLIN HFA) 108 (90 Base) MCG/ACT inhaler Inhale 2 puffs into the lungs every 6 (six) hours as needed for wheezing or shortness of breath. 1 Inhaler 0  . azithromycin (ZITHROMAX) 250 MG tablet Take 2 tablets now and then one tablet for 4 days. 6 tablet 0  . benzonatate (TESSALON) 200 MG capsule Take 1 capsule (200 mg total) by mouth 2 (two) times daily as needed for cough. 20 capsule 0  . budesonide-formoterol (SYMBICORT) 160-4.5 MCG/ACT inhaler Inhale 2 puffs into the lungs 2 (two) times daily. 1 Inhaler 0  . chlorpheniramine-HYDROcodone (TUSSIONEX) 10-8 MG/5ML SUER Take 5 mLs by mouth every 12 (twelve) hours as needed. 60 mL 0  . docusate sodium (COLACE) 100 MG capsule Take 100 mg by mouth 2 (two) times daily.    [name] doxycycline (VIBRA-TABS) 100 MG tablet Take 1 tablet (100 mg total) by mouth 2 (two) times daily. 20 tablet 0  . predniSONE (DELTASONE) 50 MG tablet Take 1 tablet (50 mg total) by mouth daily. 5 tablet 0   No facility-administered medications prior to visit.     No Known Allergies  ROS     Objective:    Physical Exam  Constitutional: He is oriented to person, place, and time. He appears well-developed and well-nourished.  HENT:  Head: Normocephalic and atraumatic.  Cardiovascular: Normal  rate, regular rhythm and normal heart sounds.  Pulmonary/Chest: Effort normal and breath sounds normal.  Genitourinary:    Penis normal.  Right testis shows no mass and no swelling. Left testis shows tenderness. Left testis shows no mass and no swelling. No penile erythema. No discharge found.    Genitourinary Comments: Is a little bit of dried brown skin at the opening of the urethra.  Did have a little bit of tenderness over the left testicle.   Neurological: He is alert and oriented to person, place, and time.  Skin: Skin is warm and dry.  Psychiatric: He has a normal mood and affect. His behavior is normal.    BP 110/77   Pulse 90   Temp (!) 97.5 F (36.4 C)   Ht 5\' 11"  (1.803 m)   Wt 179 lb (81.2 kg)   SpO2 100%   BMI 24.97 kg/m  Wt Readings from Last 3 Encounters:  07/02/18 179 lb (81.2 kg)  06/02/18 179 lb (81.2 kg)  05/01/18 184 lb (83.5 kg)    Health Maintenance Due  Topic Date Due  . HIV Screening  12/26/2009    There are no preventive care reminders to display for this patient.   Lab Results  Component Value Date   TSH 1.63 05/01/2018   Lab Results  Component Value Date   WBC 8.2 06/10/2018   HGB 16.6 06/10/2018   HCT 46.7 06/10/2018   MCV 88.6 06/10/2018   PLT 220 06/10/2018   Lab Results  Component Value Date   NA 140 05/01/2018   K 4.2 05/01/2018   CO2 28 05/01/2018   GLUCOSE 99 05/01/2018   BUN 15 05/01/2018   CREATININE 1.07 05/01/2018   BILITOT 1.6 (H) 05/01/2018   ALKPHOS 139 12/12/2010   ALKPHOS 136 12/12/2010   AST 11 05/01/2018   ALT 12 05/01/2018   PROT 7.1 05/01/2018   ALBUMIN 4.4 12/12/2010   ALBUMIN 4.4 12/12/2010   CALCIUM 9.7 05/01/2018   No results found for: CHOL No results found for: HDL No results found for: LDLCALC No results found for: TRIG No results found for: CHOLHDL No results found for: QION6EHGBA1C     Assessment & Plan:   Problem List Items Addressed This Visit    None    Visit Diagnoses    Dysuria    -   Primary   Relevant Orders   POCT urinalysis dipstick (Completed)   WET PREP FOR TRICH, YEAST, CLUE   C. trachomatis/N. gonorrhoeae RNA   Testicular pain       Relevant Orders   WET PREP FOR TRICH, YEAST, CLUE   C. trachomatis/N. gonorrhoeae RNA   Non-intractable vomiting with nausea, unspecified vomiting type       Cough           Dysuria - UA is negative. Increase fluids and call if symptoms persist.  Will check with STD and for possible yeast since notices some possible discharge.  He will drop off sample tomorrow.    Testicular pain - No sign of mass on exam. Only mildly tender oer the left testicle.   Cough - chest is clear on exam. Gave reassurance. If getting wrose please let us know. Consider spiromrty to evluat for asthma.    Nausea and vomting - she is better now. Only vomited once. Not sure of cough. He didn't feel ilke it was phlegm.     No orders of the defined types were placed in this encounter.    Nani Gasseratherine Lakayla Barrington, MD

## 2018-07-04 ENCOUNTER — Encounter: Payer: Self-pay | Admitting: Family Medicine

## 2018-07-07 ENCOUNTER — Encounter: Payer: Self-pay | Admitting: Family Medicine

## 2018-07-07 DIAGNOSIS — R6889 Other general symptoms and signs: Principal | ICD-10-CM

## 2018-07-07 DIAGNOSIS — Z20822 Contact with and (suspected) exposure to covid-19: Secondary | ICD-10-CM

## 2018-07-08 LAB — C. TRACHOMATIS/N. GONORRHOEAE RNA
C. trachomatis RNA, TMA: NOT DETECTED
N. gonorrhoeae RNA, TMA: NOT DETECTED

## 2018-07-08 LAB — CULTURE, ROUTINE-GENITAL
MICRO NUMBER:: 403479
RESULT:: NORMAL
SPECIMEN QUALITY:: ADEQUATE

## 2018-07-08 LAB — WET PREP FOR TRICH, YEAST, CLUE

## 2018-07-08 LAB — TEST AUTHORIZATION

## 2018-07-10 DIAGNOSIS — K589 Irritable bowel syndrome without diarrhea: Secondary | ICD-10-CM

## 2018-07-10 DIAGNOSIS — K219 Gastro-esophageal reflux disease without esophagitis: Secondary | ICD-10-CM

## 2018-07-10 HISTORY — DX: Gastro-esophageal reflux disease without esophagitis: K21.9

## 2018-07-10 HISTORY — DX: Irritable bowel syndrome, unspecified: K58.9

## 2018-07-14 ENCOUNTER — Encounter: Payer: Self-pay | Admitting: Physician Assistant

## 2018-07-14 MED ORDER — FLUTICASONE PROPIONATE 50 MCG/ACT NA SUSP: 2.0000 | Freq: Every day | NASAL | 6 refills | Status: DC

## 2018-08-01 LAB — SAR COV2 SEROLOGY (COVID19)AB(IGG),IA: SARS CoV2 AB IGG: NEGATIVE

## 2018-08-01 NOTE — Telephone Encounter (Signed)
No antibodies detected to COVID

## 2018-09-05 ENCOUNTER — Encounter: Payer: Self-pay | Admitting: Physician Assistant

## 2018-10-23 ENCOUNTER — Encounter: Payer: Self-pay | Admitting: Physician Assistant

## 2018-10-27 ENCOUNTER — Other Ambulatory Visit: Payer: Self-pay

## 2018-10-27 ENCOUNTER — Ambulatory Visit (INDEPENDENT_AMBULATORY_CARE_PROVIDER_SITE_OTHER): Payer: 59 | Admitting: Physician Assistant

## 2018-10-27 ENCOUNTER — Ambulatory Visit (INDEPENDENT_AMBULATORY_CARE_PROVIDER_SITE_OTHER): Payer: 59

## 2018-10-27 ENCOUNTER — Encounter: Payer: Self-pay | Admitting: Physician Assistant

## 2018-10-27 VITALS — BP 118/78 | HR 120 | Temp 98.5°F | Wt 180.0 lb

## 2018-10-27 DIAGNOSIS — M7989 Other specified soft tissue disorders: Secondary | ICD-10-CM

## 2018-10-27 DIAGNOSIS — R002 Palpitations: Secondary | ICD-10-CM | POA: Diagnosis not present

## 2018-10-27 DIAGNOSIS — R9431 Abnormal electrocardiogram [ECG] [EKG]: Secondary | ICD-10-CM | POA: Diagnosis not present

## 2018-10-27 DIAGNOSIS — R000 Tachycardia, unspecified: Secondary | ICD-10-CM

## 2018-10-27 DIAGNOSIS — R079 Chest pain, unspecified: Secondary | ICD-10-CM | POA: Diagnosis not present

## 2018-10-27 DIAGNOSIS — R05 Cough: Secondary | ICD-10-CM | POA: Diagnosis not present

## 2018-10-27 DIAGNOSIS — R058 Other specified cough: Secondary | ICD-10-CM

## 2018-10-27 DIAGNOSIS — R2242 Localized swelling, mass and lump, left lower limb: Secondary | ICD-10-CM

## 2018-10-27 HISTORY — DX: Tachycardia, unspecified: R00.0

## 2018-10-27 LAB — COMPLETE METABOLIC PANEL WITH GFR
AG Ratio: 2.4 (calc) (ref 1.0–2.5)
ALT: 19 U/L (ref 9–46)
AST: 15 U/L (ref 10–40)
Albumin: 5.1 g/dL (ref 3.6–5.1)
Alkaline phosphatase (APISO): 91 U/L (ref 36–130)
BUN: 14 mg/dL (ref 7–25)
CO2: 29 mmol/L (ref 20–32)
Calcium: 9.6 mg/dL (ref 8.6–10.3)
Chloride: 105 mmol/L (ref 98–110)
Creat: 1.01 mg/dL (ref 0.60–1.35)
GFR, Est African American: 121 mL/min/{1.73_m2} (ref >=60)
GFR, Est Non African American: 104 mL/min/{1.73_m2} (ref >=60)
Globulin: 2.1 g/dL (calc) (ref 1.9–3.7)
Glucose, Bld: 101 mg/dL — ABNORMAL HIGH (ref 65–99)
Potassium: 4.1 mmol/L (ref 3.5–5.3)
Sodium: 142 mmol/L (ref 135–146)
Total Bilirubin: 0.8 mg/dL (ref 0.2–1.2)
Total Protein: 7.2 g/dL (ref 6.1–8.1)

## 2018-10-27 LAB — CBC
HCT: 47.6 % (ref 38.5–50.0)
Hemoglobin: 16.4 g/dL (ref 13.2–17.1)
MCH: 30.1 pg (ref 27.0–33.0)
MCHC: 34.5 g/dL (ref 32.0–36.0)
MCV: 87.5 fL (ref 80.0–100.0)
MPV: 10.4 fL (ref 7.5–12.5)
Platelets: 180 10*3/uL (ref 140–400)
RBC: 5.44 10*6/uL (ref 4.20–5.80)
RDW: 13 % (ref 11.0–15.0)
WBC: 6.7 10*3/uL (ref 3.8–10.8)

## 2018-10-27 LAB — TSH+FREE T4: TSH W/REFLEX TO FT4: 1.83 mIU/L (ref 0.40–4.50)

## 2018-10-27 LAB — D-DIMER, QUANTITATIVE: D-Dimer, Quant: <0.19 mcg/mL FEU (ref <0.50)

## 2018-10-27 NOTE — Progress Notes (Signed)
HPI:                                                                Joshua Tyler is a 23 y.o. male who [facility] today for multiple concerns  4 days he noted a lump overlying his left shin. Noticed when he pushed on the lump that his left great toe and second toe would go numb. It is nontender. No overlying skin changes.    Reports irregular heart beat when laying down or sitting down describes as "heart racing and beating harder than usually." he notices that heart beat pauses and feels irregular. Onset 3 weeks ago. This occurs at least once a day. Sometimes associated with a sharper pain in left upper chest. Also endorses several weeks of chest congestion Reports he occasionally drinks 1 cup of coffee. Denies recreational drug use.  Only recent medication change was increasing Doxepin from 20 mg to 30 mg 3 weeks ago.  Also states he has had a "migraine headache" for 3 days. Sudden onset Saturday night. Has been taking Ibuprofen with mild relief (4/10). Without medication pain is 7/10. Headache is frontal bilateral described as a squeezing pain with sharp pain behind both eyes/bridge of the nose. Associated with lightheadedness. Reports he typically has tension headaches 1 day a week. This headache is different from his usual headaches in that it has lasted longer. He denies history of migraines.  Past Medical History:  Diagnosis Date  . Bipolar 1 disorder (HCC)   . Schizoaffective disorder [facility](HCC)    Past Surgical History:  Procedure Laterality Date  . WISDOM TOOTH EXTRACTION     Social History   Tobacco Use  . Smoking status: Former Smoker    Quit date: 06/25/2012    Years since quitting: 6.3  . Smokeless tobacco: Never Used  Substance Use Topics  . Alcohol use: Never    Frequency: Never   family history includes Cancer in his paternal grandfather; Heart attack in his paternal grandfather; Heart disease in his  father.    ROS: negative except as noted in the HPI  Medications: Current Outpatient Medications  Medication Sig Dispense Refill  . ARIPiprazole (ABILIFY) 5 MG tablet Take 9 mg by mouth.     . clonazePAM (KLONOPIN) 1 MG tablet Take 1 mg by mouth 2 (two) times daily.    . cloZAPine (CLOZARIL) 100 MG tablet TAKE 4 TABLETS (400 MG TOTAL) BY MOUTH NIGHTLY.  3  . cloZAPine (CLOZARIL) 25 MG tablet TAKE 1 TABLET BY MOUTH NIGHTLY. TAKE ALONG WITH 4 100MG  TABLETS  3  . doxepin (SINEQUAN) 10 MG capsule Take 1 capsule by mouth at bedtime.    . fluticasone (FLONASE) 50 MCG/ACT nasal spray Place 2 sprays into both nostrils daily. 16 g 6  . LORazepam (ATIVAN) 0.5 MG tablet Take 1 tab each morning and night, and 1 additional tablet as needed for anxiety / panic    . buPROPion (WELLBUTRIN XL) 150 MG 24 hr tablet Take 450 mg by mouth.     . lamoTRIgine (LAMICTAL) 200 MG tablet Take by mouth.    . loratadine (CLARITIN) 10 MG tablet Take by mouth.     No current facility-administered medications for this visit.  No Known Allergies     Objective:  BP 118/78   Pulse (!) 120   Temp 98.5 F (36.9 C) (Oral)   Wt 180 lb (81.6 kg)   SpO2 97%   BMI 25.10 kg/m  Wt Readings from Last 3 Encounters:  10/27/18 180 lb (81.6 kg)  07/02/18 179 lb (81.2 kg)  06/02/18 179 lb (81.2 kg)   Temp Readings from Last 3 Encounters:  10/27/18 98.5 F (36.9 C) (Oral)  07/02/18 (!) 97.5 F (36.4 C)  06/06/18 98.1 F (36.7 C) (Oral)   BP Readings from Last 3 Encounters:  10/27/18 118/78  07/02/18 110/77  06/06/18 128/70   Pulse Readings from Last 3 Encounters:  10/27/18 (!) 120  07/02/18 90  06/06/18 (!) 110    Gen:  alert, not ill-appearing, no distress, appropriate for age HEENT: head normocephalic without obvious abnormality, conjunctiva and cornea clear, trachea midline Pulm: Normal work of breathing, normal phonation, clear to auscultation bilaterally, no wheezes, rales or rhonchi CV: Normal  rate, regular rhythm, s1 and s2 distinct, no murmurs, clicks or rubs; tenderness of left chest wall/ precordial area Neuro: alert and oriented x 3, no tremor MSK: extremities atraumatic, left lateral shin there is protuberance overlying the fibula, normal gait and station, no peripheral edema Skin: intact, no rashes on exposed skin, no jaundice, no cyanosis Psych: well-groomed, cooperative, good eye contact, appears anxious, speech is articulate, and thought processes clear and goal-directed   [facility]  [address] [place], [place] [address] Phone [phone]  Fax [phone]  Pediatric Echo Transthoracic Report  Name: Virgie DadRENT, NATHANStudy Date: 10/25/2010 10:55 AM MRN: [id number] Height: 178 cm DOB: 10/10/2020Gender: Male Weight: 59 kg Age: -6 yrs BSA: 1.7 meters2 Reason For Study: 786.50 CHEST PAIN Performed By: Nathanial Millmanracy Ralston, RDCS  INTERPRETATION SUMMARY There is a patent foramen ovale Left-to-right shunt atrial shunt by color Doppler Normal right ventricular systolic function Normal left ventricular systolic function  CARDIAC POSITION Levocardia. Abdominal situs solitus. Atrial situs solitus. D Ventricular Loop. S Normal position great vessels.  VEINS Normal systemic venous connections. Normal pulmonary venous connections. Normal pulmonary vein velocity.  ATRIA Normal right atrial size. Normal left atrial size. There is a patent foramen ovale. Left-to-right shunt atrial shunt by color Doppler.  ATRIOVENTRICULAR VALVES Normal tricuspid valve. Normal  tricuspid valve inflow velocity. Trace tricuspid valve insufficiency. Inadequate amount of tricuspid valve insufficiency to estimate right ventricular pressures. Normal mitral valve. Normal mitral valve inflow velocity. Trace mitral valve insufficiency.  VENTRICLES Normal right ventricle structure and size. Normal left ventricle structure and size. Intact ventricular septum. Normal septal motion consistent with normal right ventricular pressures.  CARDIAC FUNCTION Normal right ventricular systolic function. Normal left ventricular systolic function. Septal MV annuluar TDI E/e' ratio = 6.3 (NL = <10.0).  SEMILUNAR VALVES Normal pulmonic valve. Normal pulmonic valve velocity. Trivial pulmonary valve insufficiency. Tricommissural aortic valve. Aortic valve mobility appears normal. Normal aortic valve velocity by Doppler. No aortic valve insufficiency by color Doppler.  CORONARY ARTERIES The right coronary artery is not well seen. The left coronary artery appears to arise normally by two-dimensional imaging, but is not confirmed by color Doppler.  GREAT ARTERIES Left aortic arch with normal branching pattern. No evidence of coarctation of the aorta. Branch pulmonary arteries not optimally imaged, appear grossly normal.  SHUNTS No patent ductus arteriosus.  EXTRACARDIAC No pericardial effusion. There is no pleural effusion.  Z-Score Result Measurement Name Value Normal Range Z-Score LVIDd4.9 cm4.0 - 5.70.22 IVSd 0.81  cm 0.49 - 1.110.04 LVIDs3.4 cm2.2 - 3.80.93 LVPWd0.76 cm 0.50 - 0.990.13 LA dimension 3.2 cm2.0 - 3.41.6 Ao root diam 2.8 cm2.1 -  3.20.62   MMode/2D Measurements & Calculations IVS/LVPW: 1.1 LV mass(C)dI: 74.2 grams/m2LA/Ao: 1.1 FS: 31.7 %  ___________________________________________________________________________________________   Electronically signed by: Kathie Rhodes, MD on 10/26/2010 12:22 PM  ECG 10/27/18 16:28 Vent rate 120 bpm PR-I 158 ms QRS 96 ms QT/QTc 318/449 ms Sinus tachycardia Rightward axis Borderline ECG   Assessment and Plan: 23 y.o. male with   .Diagnoses and all orders for this visit:  Sinus tachycardia by electrocardiogram -     HOLTER MONITOR - 72 HOUR  Tachycardia with heart rate 100-120 beats per minute -     TSH + free T4 -     CBC -     COMPLETE METABOLIC PANEL WITH GFR -     D-dimer, quantitative (not at [facility]) -     EKG 12-Lead  Left-sided chest pain -     TSH + free T4 -     CBC -     COMPLETE METABOLIC PANEL WITH GFR -     D-dimer, quantitative (not at [facility]) -     HOLTER MONITOR - 72 HOUR -     DG Chest 2 View  Right axis deviation -     HOLTER MONITOR - 72 HOUR  Palpitations -     HOLTER MONITOR - 72 HOUR  Productive cough -     DG Chest 2 View  Mass of soft tissue of left lower extremity -     [name] LT LOWER EXTREM LTD SOFT TISSUE NON VASCULAR  Localized swelling, mass, or lump of left lower extremity -     DG Tibia/Fibula Left   Palpitations, Left-sided chest pain Afebrile, tachycardic at 120 bpm, pulse ox 97% on RA at rest, left-sided chest wall pain on exam, no adventitious lung sounds ECG personally reviewed by me and Dr. Dianah Field shows sinus tach with rightward axis, no prior ECG for comparison Personally reviewed outside records with Pedi Echo dated 10/26/10 showing a left to right shunt and PFO D-dimer stat pending CXR pending Long-term holter pending  LLE lump DDx includes lipoma and bony mass/neoplasm Soft tissue US and X-ray pending  Headache Cardiac work-up took precedence  today Recommended close follow-up in office tomorrow  Patient education and anticipatory guidance given Patient agrees with treatment plan Follow-up as needed if symptoms worsen or fail to improve  I spent 40 minutes with this patient, greater than 50% was face-to-face time counseling regarding the above diagnoses   Darlyne Russian PA-C

## 2018-10-28 ENCOUNTER — Ambulatory Visit (INDEPENDENT_AMBULATORY_CARE_PROVIDER_SITE_OTHER): Payer: 59 | Admitting: Physician Assistant

## 2018-10-28 ENCOUNTER — Telehealth: Payer: Self-pay | Admitting: Radiology

## 2018-10-28 ENCOUNTER — Ambulatory Visit (HOSPITAL_COMMUNITY)
Admission: RE | Admit: 2018-10-28 | Discharge: 2018-10-28 | Disposition: A | Discharge status: Home / Self Care | Payer: 59 | Source: Ambulatory Visit | Attending: Physician Assistant | Admitting: Physician Assistant

## 2018-10-28 ENCOUNTER — Encounter: Payer: Self-pay | Admitting: Physician Assistant

## 2018-10-28 ENCOUNTER — Other Ambulatory Visit: Payer: 59

## 2018-10-28 VITALS — BP 119/79 | HR 113 | Temp 97.7°F

## 2018-10-28 DIAGNOSIS — Q211 Atrial septal defect: Secondary | ICD-10-CM

## 2018-10-28 DIAGNOSIS — R51 Headache: Secondary | ICD-10-CM | POA: Insufficient documentation

## 2018-10-28 DIAGNOSIS — I280 Arteriovenous fistula of pulmonary vessels: Secondary | ICD-10-CM

## 2018-10-28 DIAGNOSIS — R519 Headache, unspecified: Secondary | ICD-10-CM

## 2018-10-28 DIAGNOSIS — R9431 Abnormal electrocardiogram [ECG] [EKG]: Secondary | ICD-10-CM

## 2018-10-28 DIAGNOSIS — IMO0002 Reserved for concepts with insufficient information to code with codable children: Secondary | ICD-10-CM | POA: Insufficient documentation

## 2018-10-28 DIAGNOSIS — Q2112 Patent foramen ovale: Secondary | ICD-10-CM

## 2018-10-28 DIAGNOSIS — R000 Tachycardia, unspecified: Secondary | ICD-10-CM

## 2018-10-28 HISTORY — DX: Headache, unspecified: R51.9

## 2018-10-28 HISTORY — DX: Reserved for concepts with insufficient information to code with codable children: IMO0002

## 2018-10-28 HISTORY — DX: Patent foramen ovale: Q21.12

## 2018-10-28 HISTORY — DX: Atrial septal defect: Q21.1

## 2018-10-28 HISTORY — DX: Abnormal electrocardiogram (ECG) (EKG): R94.31

## 2018-10-28 MED ORDER — GADOBUTROL 1 MMOL/ML IV SOLN
8.0000 mL | Freq: Once | INTRAVENOUS | Status: AC | PRN
  Administered 2018-10-28: 8 mL via INTRAVENOUS

## 2018-10-28 NOTE — Progress Notes (Signed)
HPI:                                                                Joshua Tyler is a 23 y.o. male who [facility] today for tachycardia and headache follow-up  History is provided by patient and his partner, [name], who is present on speaker phone.  Patient with PMH of PFO with left to right shunt, palpitations, splenomegaly, GERD, and schizoaffective disorder presented yesterday with 3 weeks of  "irregular heart beat." Symptoms occur when laying down or sitting down and described as "heart racing and beating harder than usual." He notices that his heart beat pauses and feels irregular. This occurs at least once a day lasting for several seconds up to a minute. Sometimes associated with a sharper pain in left upper chest and lightheadedness. Also endorses several weeks of "chest congestion." Denies fever, chills, malaise, myalgia, SOB. No known sick contacts. Reports he occasionally drinks 1 cup of coffee. Denies recreational drug use.  Only recent medication change was increasing Doxepin from 20 mg to 30 mg 3 weeks ago. Of note today, patient's partner tells me that he used to take Propranolol for a medication side effect and this was discontinued some time in 2019 because he was no longer needing it. Work-up 10/27/18 ECG sinus tachycardia, rightward axis CXR negative CMP, CBC, TSH, d-dimer negative  Also states he has had a "migraine" headache for 4 days. Sudden onset Saturday night. Has been taking Ibuprofen with mild relief (4/10). Without medication pain is 7/10. Headache is frontal bilateral described as a squeezing pain with sharp pain behind both eyes/bridge of the nose. Associated with lightheadedness. Reports he typically has tension headaches 1 day a week, but this is different from his usual headache. He denies history of migraines.  Past Medical History:  Diagnosis Date  . Bipolar 1 disorder (HCC)   . Schizoaffective  disorder [facility](HCC)    Past Surgical History:  Procedure Laterality Date  . WISDOM TOOTH EXTRACTION     Social History   Tobacco Use  . Smoking status: Former Smoker    Quit date: 06/25/2012    Years since quitting: 6.3  . Smokeless tobacco: Never Used  Substance Use Topics  . Alcohol use: Never    Frequency: Never   family history includes Cancer in his paternal grandfather; Heart attack in his paternal grandfather; Heart disease in his father.    ROS: negative except as noted in the HPI  Medications: Current Outpatient Medications  Medication Sig Dispense Refill  . ARIPiprazole (ABILIFY) 10 MG tablet Take 10 mg by mouth daily.    [name]. buPROPion (WELLBUTRIN XL) 300 MG 24 hr tablet Take 300 mg by mouth daily.    . clonazePAM (KLONOPIN) 0.5 MG tablet Take 0.5 mg by mouth 2 (two) times daily.    . cloZAPine (CLOZARIL) 100 MG tablet TAKE 4 TABLETS (400 MG TOTAL) BY MOUTH NIGHTLY.  3  . doxepin (SINEQUAN) 10 MG capsule Take 3 capsules by mouth at bedtime.    . fluticasone (FLONASE) 50 MCG/ACT nasal spray Place 2 sprays into both nostrils daily. 16 g 6  . lamoTRIgine (LAMICTAL) 200 MG tablet Take 200 mg by mouth daily.    [name]. loratadine (CLARITIN)  10 MG tablet Take 10 mg by mouth daily.    [name]. LORazepam (ATIVAN) 0.5 MG tablet Take 1 tab each morning and night, and 1 additional tablet as needed for anxiety / panic     No current facility-administered medications for this visit.    No Known Allergies     Objective:  BP 119/79   Pulse (!) 113   Temp 97.7 F (36.5 C) (Oral)   SpO2 97%  Wt Readings from Last 3 Encounters:  10/27/18 180 lb (81.6 kg)  07/02/18 179 lb (81.2 kg)  06/02/18 179 lb (81.2 kg)   Temp Readings from Last 3 Encounters:  10/28/18 97.7 F (36.5 C) (Oral)  10/27/18 98.5 F (36.9 C) (Oral)  07/02/18 (!) 97.5 F (36.4 C)   BP Readings from Last 3 Encounters:  10/28/18 119/79  10/27/18 118/78  07/02/18 110/77   Pulse Readings from Last 3 Encounters:   10/28/18 (!) 113  10/27/18 (!) 120  07/02/18 90    Gen: well-groomed, not ill-appearing, no acute distress HEENT: head normocephalic, atraumatic; conjunctiva and cornea clear, oropharynx clear, moist mucus membranes; neck supple, no meningeal signs Pulm: Normal work of breathing, normal phonation, clear to auscultation bilaterally CV: tachycardic at 108-116 bpm, regular rhythm, s1 and s2 distinct, no murmurs, clicks or rubs Neuro:  cranial nerves II-XII intact, no nystagmus, normal finger-to-nose, normal heel-to-shin, negative pronator drift, normal rapid alternating movements, DTR's are hyporeflexic and unable elicit patellar DTRs, normal tone, no tremor MSK: strength 5/5 and symmetric in bilateral upper and lower extremities, normal gait and station, positive Romberg Mental Status: alert and oriented x 3, speech articulate, thought processes clear and goal-directed    ECG 10/27/18 16:28 Vent rate 120 bpm PR-I 158 ms QRS 96 ms QT/QTc 318/449 ms Sinus tachycardia Rightward axis Borderline ECG  Results for orders placed or performed in visit on 10/27/18 (from the past 72 hour(s))  TSH + free T4     Status: None   Collection Time: 10/27/18  4:42 PM  Result Value Ref Range   TSH W/REFLEX TO FT4 1.83 0.40 - 4.50 mIU/L  CBC     Status: None   Collection Time: 10/27/18  4:42 PM  Result Value Ref Range   WBC 6.7 3.8 - 10.8 Thousand/uL   RBC 5.44 4.20 - 5.80 Million/uL   Hemoglobin 16.4 13.2 - 17.1 g/dL   HCT 16.147.6 09.638.5 - 04.550.0 %   MCV 87.5 80.0 - 100.0 fL   MCH 30.1 27.0 - 33.0 pg   MCHC 34.5 32.0 - 36.0 g/dL   RDW 40.913.0 81.111.0 - 91.415.0 %   Platelets 180 140 - 400 Thousand/uL   MPV 10.4 7.5 - 12.5 fL  COMPLETE METABOLIC PANEL WITH GFR     Status: Abnormal   Collection Time: 10/27/18  4:42 PM  Result Value Ref Range   Glucose, Bld 101 (H) 65 - 99 mg/dL    Comment: .            Fasting reference interval . For someone without known diabetes, a glucose value between 100 and 125  mg/dL is consistent with prediabetes and should be confirmed with a follow-up test. .    BUN 14 7 - 25 mg/dL   Creat 7.821.01 9.560.60 - 2.131.35 mg/dL   GFR, Est Non African American 104 > OR = 60 mL/min/1.6173m2   GFR, Est African American 121 > OR = 60 mL/min/1.5573m2   BUN/Creatinine Ratio NOT APPLICABLE 6 - 22 (calc)   Sodium 142  135 - 146 mmol/L   Potassium 4.1 3.5 - 5.3 mmol/L   Chloride 105 98 - 110 mmol/L   CO2 29 20 - 32 mmol/L   Calcium 9.6 8.6 - 10.3 mg/dL   Total Protein 7.2 6.1 - 8.1 g/dL   Albumin 5.1 3.6 - 5.1 g/dL   Globulin 2.1 1.9 - 3.7 g/dL (calc)   AG Ratio 2.4 1.0 - 2.5 (calc)   Total Bilirubin 0.8 0.2 - 1.2 mg/dL   Alkaline phosphatase (APISO) 91 36 - 130 U/L   AST 15 10 - 40 U/L   ALT 19 9 - 46 U/L  D-dimer, quantitative (not at [facility])     Status: None   Collection Time: 10/27/18  4:42 PM  Result Value Ref Range   D-Dimer, Quant <0.19 <0.50 mcg/mL FEU    Comment: . The D-Dimer test is used frequently to exclude an acute PE or DVT. In patients with a low to moderate clinical risk assessment and a D-Dimer result <0.50 mcg/mL FEU, the likelihood of a PE or DVT is very low. However, a thromboembolic event should not be excluded solely on the basis of the D-Dimer level. Increased levels of D-Dimer are associated with a PE, DVT, DIC, malignancies, inflammation, sepsis, surgery, trauma, pregnancy, and advancing patient age. [[place] 2006 11:295(2):199-207] . For additional information, please refer to: http://education.questdiagnostics.com/faq/FAQ149 (This link is being provided for informational/ educational purposes only) .    Dg Chest 2 View  Result Date: 10/27/2018 CLINICAL DATA:  Chest pain and shortness of breath EXAM: CHEST - 2 VIEW COMPARISON:  June 10, 2018 FINDINGS: Lungs are clear. Heart size and pulmonary vascularity are normal. No adenopathy. No pneumothorax. No bone lesions. IMPRESSION: No edema or consolidation. Electronically Signed   By: Lowella Grip III M.D.   On: 10/27/2018 17:04    [facility]  [address] [place], [place] [address] Phone [phone]  Fax [phone]  Pediatric Echo Transthoracic Report  Name: DYWANE, PERUSKI Date: 10/25/2010 10:55 AM MRN: [id number] Height: 178 cm DOB: 10/10/2020Gender: Male Weight: 59 kg Age: -6 yrs BSA: 1.7 meters2 Reason For Study: 786.50 CHEST PAIN Performed By: Iva Lento, RDCS  INTERPRETATION SUMMARY There is a patent foramen ovale Left-to-right shunt atrial shunt by color Doppler Normal right ventricular systolic function Normal left ventricular systolic function  CARDIAC POSITION Levocardia. Abdominal situs solitus. Atrial situs solitus. D Ventricular Loop. S Normal position great vessels.  VEINS Normal systemic venous connections. Normal pulmonary venous connections. Normal pulmonary vein velocity.  ATRIA Normal right atrial size. Normal left atrial size. There is a patent foramen ovale. Left-to-right shunt atrial shunt by color Doppler.  ATRIOVENTRICULAR VALVES Normal tricuspid valve. Normal tricuspid valve inflow velocity. Trace tricuspid valve insufficiency. Inadequate amount of tricuspid valve insufficiency to estimate right ventricular pressures. Normal mitral valve. Normal mitral valve inflow velocity. Trace mitral valve insufficiency.  VENTRICLES Normal right ventricle structure and size. Normal left ventricle structure and size. Intact ventricular septum. Normal septal motion consistent with  normal right ventricular pressures.  CARDIAC FUNCTION Normal right ventricular systolic function. Normal left ventricular systolic function. Septal MV annuluar TDI E/e' ratio = 6.3 (NL = <10.0).  SEMILUNAR VALVES Normal pulmonic valve. Normal pulmonic valve velocity. Trivial pulmonary valve insufficiency. Tricommissural aortic valve. Aortic valve mobility appears normal. Normal aortic valve velocity by Doppler. No aortic valve insufficiency by color Doppler.  CORONARY ARTERIES The right coronary artery is not well seen. The left coronary artery appears to arise normally by two-dimensional  imaging, but is not confirmed by color Doppler.  GREAT ARTERIES Left aortic arch with normal branching pattern. No evidence of coarctation of the aorta. Branch pulmonary arteries not optimally imaged, appear grossly normal.  SHUNTS No patent ductus arteriosus.  EXTRACARDIAC No pericardial effusion. There is no pleural effusion.  Z-Score Result Measurement Name Value Normal Range Z-Score LVIDd4.9 cm4.0 - 5.70.22 IVSd 0.81 cm 0.49 - 1.110.04 LVIDs3.4 cm2.2 - 3.80.93 LVPWd0.76 cm 0.50 - 0.990.13 LA dimension 3.2 cm2.0 - 3.41.6 Ao root diam 2.8 cm2.1 - 3.20.62   MMode/2D Measurements & Calculations IVS/LVPW: 1.1 LV mass(C)dI: 74.2 grams/m2LA/Ao: 1.1 FS: 31.7 %  ___________________________________________________________________________________________   Electronically signed by: Cristy FolksGregory Fleming, MD on 10/26/2010 12:22 PM    Assessment and Plan: 23 y.o. male with   .Harrold Donathathan was seen today for follow-up.  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) -     ECHOCARDIOGRAM COMPLETE BUBBLE STUDY -     Ambulatory referral to Cardiology  Sinus tachycardia by electrocardiogram -     ECHOCARDIOGRAM COMPLETE BUBBLE STUDY -     Ambulatory referral to Cardiology  Right axis deviation -     ECHOCARDIOGRAM COMPLETE BUBBLE STUDY -     Ambulatory referral to Cardiology    Acute intractable headache Patient with 4 days of frontal headache associated with lightheadedness that is different from usual headache pattern with positive Romberg and hyporeflexia Given patient's history of PFO, migraine and vestibular migraine are on the differential Due to presence of focal neurologic deficit (positive Romberg and abnormal DTR's), ordering MRI brain w and w/o contrast stat If MRI is negative, he can return for nurse visit for Toradol 30 mg IM and Decadron 4 mg IM for headache  Sinus tachycardia Personally reviewed lab results and imaging. TSH, CMP, CBC d-dimer unremarkable CXR negative ECG yesterday showed rightward axis  Personally reviewed echo report dated 10/26/10 showing PFO with left to right shunt Repeat echo and long-term monitor pending We discussed that medication may be a cause of his tachycardia, specifically Clozapine. Will route office note and lab results to Psychiatry Referral to Cardiology for further eval  Patient education and anticipatory guidance given Patient agrees with treatment plan Follow-up as needed if symptoms worsen or fail to improve  Levonne Hubertharley E. Chealsea Paske PA-C

## 2018-10-28 NOTE — Telephone Encounter (Signed)
Enrolled patient for a 3 day Zio monitor to be mailed. Brief instructions were gone over with patients wife(DPR on file) and she knows to expect the monitor to arrive in 3-4 days

## 2018-10-29 ENCOUNTER — Other Ambulatory Visit: Payer: Self-pay

## 2018-10-29 ENCOUNTER — Ambulatory Visit (HOSPITAL_COMMUNITY): Payer: 59 | Attending: Cardiovascular Disease

## 2018-10-29 ENCOUNTER — Ambulatory Visit (INDEPENDENT_AMBULATORY_CARE_PROVIDER_SITE_OTHER): Payer: 59 | Admitting: Osteopathic Medicine

## 2018-10-29 ENCOUNTER — Ambulatory Visit (INDEPENDENT_AMBULATORY_CARE_PROVIDER_SITE_OTHER): Payer: 59

## 2018-10-29 VITALS — BP 115/61 | HR 126

## 2018-10-29 DIAGNOSIS — R2242 Localized swelling, mass and lump, left lower limb: Secondary | ICD-10-CM | POA: Diagnosis not present

## 2018-10-29 DIAGNOSIS — R519 Headache, unspecified: Secondary | ICD-10-CM

## 2018-10-29 DIAGNOSIS — R9431 Abnormal electrocardiogram [ECG] [EKG]: Secondary | ICD-10-CM | POA: Insufficient documentation

## 2018-10-29 DIAGNOSIS — R000 Tachycardia, unspecified: Secondary | ICD-10-CM | POA: Insufficient documentation

## 2018-10-29 DIAGNOSIS — R51 Headache: Secondary | ICD-10-CM

## 2018-10-29 DIAGNOSIS — Q211 Atrial septal defect: Secondary | ICD-10-CM | POA: Insufficient documentation

## 2018-10-29 DIAGNOSIS — I280 Arteriovenous fistula of pulmonary vessels: Secondary | ICD-10-CM | POA: Diagnosis not present

## 2018-10-29 MED ORDER — DEXAMETHASONE SODIUM PHOSPHATE 10 MG/ML IJ SOLN: 4.0000 mg | Freq: Once | INTRAMUSCULAR | Status: DC

## 2018-10-29 MED ORDER — DEXAMETHASONE SODIUM PHOSPHATE 10 MG/ML IJ SOLN
4.0000 mg | Freq: Once | INTRAMUSCULAR | Status: AC
  Administered 2018-10-29: 4 mg via INTRAMUSCULAR

## 2018-10-29 MED ORDER — KETOROLAC TROMETHAMINE 30 MG/ML IJ SOLN
30.0000 mg | Freq: Once | INTRAMUSCULAR | Status: AC
  Administered 2018-10-29: 30 mg via INTRAMUSCULAR

## 2018-10-29 NOTE — Progress Notes (Signed)
Established Patient Office Visit  Subjective:  Patient ID: Joshua Tyler, Joshua Tyler    DOB: 1996/03/04  Age: 23 y.o. MRN: [id number]  CC:  Chief Complaint  Patient presents with  . Headache    HPI Joshua Schwalbeathan Moroni presents for Dexamethasone injection and Ketorolac injection for headache. See MRI result note.   Notes recorded by Carlis Stableummings, Charley Elizabeth, PA-C on 10/29/2018 at 8:07 AM EDT  Brain MRI does not show any abnormality  Headache is likely due to a migraine  You can schedule a nurse visit today for medications if you are still experiencing headache   Okay to give patient Toradol 30 mg and Decadron 4 mg         Past Medical History:  Diagnosis Date  . Bipolar 1 disorder (HCC)   . Schizoaffective disorder [facility](HCC)     Past Surgical History:  Procedure Laterality Date  . WISDOM TOOTH EXTRACTION      Family History  Problem Relation Age of Onset  . Heart disease Father   . Heart attack Paternal Grandfather   . Cancer Paternal Grandfather        colon and prostate    Social History   Socioeconomic History  . Marital status: Married    Spouse name: Not on file  . Number of children: Not on file  . Years of education: Not on file  . Highest education level: Not on file  Occupational History  . Not on file  Social Needs  . Financial resource strain: Not on file  . Food insecurity    Worry: Not on file    Inability: Not on file  . Transportation needs    Medical: Not on file    Non-medical: Not on file  Tobacco Use  . Smoking status: Former Smoker    Quit date: 06/25/2012    Years since quitting: 6.3  . Smokeless tobacco: Never Used  Substance and Sexual Activity  . Alcohol use: Never    Frequency: Never  . Drug use: Not Currently    Types: Marijuana  . Sexual activity: Yes    Birth control/protection: Condom, Pill  Lifestyle  . Physical activity    Days per week: Not on file    Minutes per session: Not on file  . Stress: Not on file  Relationships   . Social Musicianconnections    Talks on phone: Not on file    Gets together: Not on file    Attends religious service: Not on file    Active member of club or organization: Not on file    Attends meetings of clubs or organizations: Not on file    Relationship status: Not on file  . Intimate partner violence    Fear of current or ex partner: Not on file    Emotionally abused: Not on file    Physically abused: Not on file    Forced sexual activity: Not on file  Other Topics Concern  . Not on file  Social History Narrative  . Not on file    Outpatient Medications Prior to Visit  Medication Sig Dispense Refill  . ARIPiprazole (ABILIFY) 10 MG tablet Take 10 mg by mouth daily.    [name]. buPROPion (WELLBUTRIN XL) 300 MG 24 hr tablet Take 300 mg by mouth daily.    . clonazePAM (KLONOPIN) 0.5 MG tablet Take 0.5 mg by mouth 2 (two) times daily.    . cloZAPine (CLOZARIL) 100 MG tablet TAKE 4 TABLETS (400 MG TOTAL) BY MOUTH NIGHTLY.  3  . doxepin (SINEQUAN) 10 MG capsule Take 3 capsules by mouth at bedtime.    . fluticasone (FLONASE) 50 MCG/ACT nasal spray Place 2 sprays into both nostrils daily. 16 g 6  . loratadine (CLARITIN) 10 MG tablet Take 10 mg by mouth daily.    [name] LORazepam (ATIVAN) 0.5 MG tablet Take 1 tab each morning and night, and 1 additional tablet as needed for anxiety / panic    . lamoTRIgine (LAMICTAL) 200 MG tablet Take 200 mg by mouth daily.     No facility-administered medications prior to visit.     No Known Allergies  ROS Review of Systems    Objective:    Physical Exam  BP 115/61   Pulse (!) 126   SpO2 96%  Wt Readings from Last 3 Encounters:  10/27/18 180 lb (81.6 kg)  07/02/18 179 lb (81.2 kg)  06/02/18 179 lb (81.2 kg)     Health Maintenance Due  Topic Date Due  . HIV Screening  12/26/2009  . INFLUENZA VACCINE  10/18/2018    There are no preventive care reminders to display for this patient.  Lab Results  Component Value Date   TSH 1.63 05/01/2018    Lab Results  Component Value Date   WBC 6.7 10/27/2018   HGB 16.4 10/27/2018   HCT 47.6 10/27/2018   MCV 87.5 10/27/2018   PLT 180 10/27/2018   Lab Results  Component Value Date   NA 142 10/27/2018   K 4.1 10/27/2018   CO2 29 10/27/2018   GLUCOSE 101 (H) 10/27/2018   BUN 14 10/27/2018   CREATININE 1.01 10/27/2018   BILITOT 0.8 10/27/2018   ALKPHOS 139 12/12/2010   ALKPHOS 136 12/12/2010   AST 15 10/27/2018   ALT 19 10/27/2018   PROT 7.2 10/27/2018   ALBUMIN 4.4 12/12/2010   ALBUMIN 4.4 12/12/2010   CALCIUM 9.6 10/27/2018   No results found for: CHOL No results found for: HDL No results found for: LDLCALC No results found for: TRIG No results found for: CHOLHDL No results found for: HGBA1C    Assessment & Plan:  Headache - Dexamethasone 4 mg and Ketorolac 30 mg given IM - Patient tolerated injection well without complications.    Problem List Items Addressed This Visit    Acute intractable headache - Primary      Meds ordered this encounter  Medications  . ketorolac (TORADOL) 30 MG/ML injection 30 mg  . DISCONTD: dexamethasone (DECADRON) injection 4 mg  . dexamethasone (DECADRON) injection 4 mg    Follow-up: Return if symptoms worsen or fail to improve.    Lavell Luster, [place]

## 2018-11-01 ENCOUNTER — Other Ambulatory Visit (INDEPENDENT_AMBULATORY_CARE_PROVIDER_SITE_OTHER): Payer: 59

## 2018-11-01 DIAGNOSIS — R9431 Abnormal electrocardiogram [ECG] [EKG]: Secondary | ICD-10-CM

## 2018-11-01 DIAGNOSIS — R079 Chest pain, unspecified: Secondary | ICD-10-CM

## 2018-11-01 DIAGNOSIS — R000 Tachycardia, unspecified: Secondary | ICD-10-CM | POA: Diagnosis not present

## 2018-11-01 DIAGNOSIS — R002 Palpitations: Secondary | ICD-10-CM

## 2018-11-04 ENCOUNTER — Ambulatory Visit (INDEPENDENT_AMBULATORY_CARE_PROVIDER_SITE_OTHER): Payer: 59

## 2018-11-04 ENCOUNTER — Other Ambulatory Visit: Payer: Self-pay

## 2018-11-04 DIAGNOSIS — R2242 Localized swelling, mass and lump, left lower limb: Secondary | ICD-10-CM

## 2018-11-05 ENCOUNTER — Encounter: Payer: Self-pay | Admitting: Physician Assistant

## 2018-11-05 DIAGNOSIS — Q742 Other congenital malformations of lower limb(s), including pelvic girdle: Secondary | ICD-10-CM

## 2018-11-05 HISTORY — DX: Other congenital malformations of lower limb(s), including pelvic girdle: Q74.2

## 2018-11-07 ENCOUNTER — Other Ambulatory Visit: Payer: Self-pay

## 2018-11-07 ENCOUNTER — Ambulatory Visit (INDEPENDENT_AMBULATORY_CARE_PROVIDER_SITE_OTHER): Payer: 59 | Admitting: Physician Assistant

## 2018-11-07 ENCOUNTER — Encounter: Payer: Self-pay | Admitting: Physician Assistant

## 2018-11-07 VITALS — BP 124/77 | HR 119 | Temp 98.1°F | Ht 71.0 in | Wt 181.0 lb

## 2018-11-07 DIAGNOSIS — M898X6 Other specified disorders of bone, lower leg: Secondary | ICD-10-CM

## 2018-11-07 DIAGNOSIS — M8930 Hypertrophy of bone, unspecified site: Secondary | ICD-10-CM

## 2018-11-07 DIAGNOSIS — R2242 Localized swelling, mass and lump, left lower limb: Secondary | ICD-10-CM

## 2018-11-07 DIAGNOSIS — M8580 Other specified disorders of bone density and structure, unspecified site: Secondary | ICD-10-CM | POA: Diagnosis not present

## 2018-11-07 DIAGNOSIS — R2240 Localized swelling, mass and lump, unspecified lower limb: Secondary | ICD-10-CM | POA: Diagnosis not present

## 2018-11-07 DIAGNOSIS — M79662 Pain in left lower leg: Secondary | ICD-10-CM | POA: Diagnosis not present

## 2018-11-07 HISTORY — DX: Other specified disorders of bone, lower leg: M89.8X6

## 2018-11-07 HISTORY — DX: Hypertrophy of bone, unspecified site: M89.30

## 2018-11-07 HISTORY — DX: Other specified disorders of bone density and structure, unspecified site: M85.80

## 2018-11-07 HISTORY — DX: Localized swelling, mass and lump, left lower limb: R22.42

## 2018-11-07 NOTE — Progress Notes (Signed)
   Subjective:    Patient ID: Joshua Tyler, male    DOB: 05/29/1995, 23 y.o.   MRN: [id number]  HPI  Pt is a 23 yo male with left leg mass, numbness and tingling for 2 weeks.doppler negative for blood clot. Xray reassuring but did show some left fibula thickening. He continues to have constant numbness and tingling of left leg where mass is and radiating down leg. Pain and symptoms do seem to worsen when he bears weight but are constant at all times. Cool or warm compresses do not help.   .. Active Ambulatory Problems    Diagnosis Date Noted  . Bipolar 1 disorder ([place]) 07/21/2017  . Schizoaffective disorder ([place]) 07/21/2017  . Gastroesophageal reflux disease with esophagitis 07/21/2017  . Spleen enlarged 07/30/2017  . Status post cholecystectomy 09/30/2017  . Palpitations 05/01/2018  . Sinus tachycardia by electrocardiogram 10/27/2018  . PFO (patent foramen ovale) 10/28/2018  . Right axis deviation 10/28/2018  . Acute intractable headache 10/28/2018  . Abnormality of fibula 11/05/2018  . Bone thickening 11/07/2018  . Lower leg mass, left 11/07/2018  . Pain of left fibula 11/07/2018   Resolved Ambulatory Problems    Diagnosis Date Noted  . Dysuria 10/02/2017  . 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     Objective:   Physical Exam Vitals signs reviewed.  Constitutional:      Appearance: Normal appearance.  Cardiovascular:     Rate and Rhythm: Tachycardia present.  Pulmonary:     Effort: Pulmonary effort is normal.  Musculoskeletal:     Comments: Mid left fibula firm mass about 3cm by 4cm on anterior leg just over and to the left of the fibula. No warmth or redness. Tender to palpation.  No swelling.   NROM of left leg and foot.  No calf tenderness.   Neurological:     General: No focal deficit present.     Mental Status: He is alert and oriented to person, place, and time.  Psychiatric:         Mood and Affect: Mood normal.           Assessment & Plan:  Marland KitchenMarland KitchenEstelle was seen today for leg pain.  Diagnoses and all orders for this visit:  Pain of left fibula -     MR TIBIA FIBULA LEFT WO CONTRAST  Mass of shin -     MR TIBIA FIBULA LEFT WO CONTRAST  Bone thickening -     MR TIBIA FIBULA LEFT WO CONTRAST   Xray results: Diffuse nonspecific cortical thickening of the fibula of left leg.   Unclear etiology. Consulted with supervising physician.  Does not appear like infection. No leg swelling and venous doppler ruled out blood clot. There is bone thickening and firm mass of anterior leg. Will get MRI. Discussed could be a stress fracture. No known injury or new exercise. Concern for soft tissue mass but xray did not show anything like that. Tylenol/ibuprofen for pain/discomfort.

## 2018-11-10 ENCOUNTER — Encounter: Payer: Self-pay | Admitting: Physician Assistant

## 2018-11-11 NOTE — Progress Notes (Signed)
Imaging advised. Prior auth received

## 2018-11-14 ENCOUNTER — Ambulatory Visit: Payer: 59 | Admitting: Cardiology

## 2018-11-18 ENCOUNTER — Other Ambulatory Visit: Payer: Self-pay

## 2018-11-18 ENCOUNTER — Ambulatory Visit (INDEPENDENT_AMBULATORY_CARE_PROVIDER_SITE_OTHER): Payer: 59

## 2018-11-18 DIAGNOSIS — M8580 Other specified disorders of bone density and structure, unspecified site: Secondary | ICD-10-CM | POA: Diagnosis not present

## 2018-11-18 DIAGNOSIS — M79662 Pain in left lower leg: Secondary | ICD-10-CM | POA: Diagnosis not present

## 2018-11-18 DIAGNOSIS — R2240 Localized swelling, mass and lump, unspecified lower limb: Secondary | ICD-10-CM

## 2018-11-18 MED ORDER — GADOBUTROL 1 MMOL/ML IV SOLN
8.0000 mL | Freq: Once | INTRAVENOUS | Status: AC | PRN
  Administered 2018-11-18: 16:00:00 8 mL via INTRAVENOUS

## 2018-11-20 ENCOUNTER — Encounter: Payer: Self-pay | Admitting: Family Medicine

## 2018-11-20 ENCOUNTER — Other Ambulatory Visit: Payer: Self-pay

## 2018-11-20 ENCOUNTER — Ambulatory Visit (INDEPENDENT_AMBULATORY_CARE_PROVIDER_SITE_OTHER): Payer: 59 | Admitting: Family Medicine

## 2018-11-20 ENCOUNTER — Encounter: Payer: Self-pay | Admitting: Physician Assistant

## 2018-11-20 VITALS — BP 122/71 | HR 120 | Temp 98.3°F | Wt 184.0 lb

## 2018-11-20 DIAGNOSIS — M792 Neuralgia and neuritis, unspecified: Secondary | ICD-10-CM

## 2018-11-20 DIAGNOSIS — M8430XA Stress fracture, unspecified site, initial encounter for fracture: Secondary | ICD-10-CM | POA: Diagnosis not present

## 2018-11-20 DIAGNOSIS — Z5181 Encounter for therapeutic drug level monitoring: Secondary | ICD-10-CM

## 2018-11-20 DIAGNOSIS — Z23 Encounter for immunization: Secondary | ICD-10-CM | POA: Diagnosis not present

## 2018-11-20 DIAGNOSIS — Q742 Other congenital malformations of lower limb(s), including pelvic girdle: Secondary | ICD-10-CM

## 2018-11-20 DIAGNOSIS — S86892A Other injury of other muscle(s) and tendon(s) at lower leg level, left leg, initial encounter: Secondary | ICD-10-CM

## 2018-11-20 HISTORY — DX: Other injury of other muscle(s) and tendon(s) at lower leg level, left leg, initial encounter: S86.892A

## 2018-11-20 LAB — CBC WITH DIFFERENTIAL/PLATELET
Absolute Monocytes: 378 cells/uL (ref 200–950)
Basophils Absolute: 28 cells/uL (ref 0–200)
Basophils Relative: 0.4 %
Eosinophils Absolute: 0 cells/uL — ABNORMAL LOW (ref 15–500)
Eosinophils Relative: 0 %
HCT: 45.4 % (ref 38.5–50.0)
Hemoglobin: 15.8 g/dL (ref 13.2–17.1)
Lymphs Abs: 1659 cells/uL (ref 850–3900)
MCH: 30.3 pg (ref 27.0–33.0)
MCHC: 34.8 g/dL (ref 32.0–36.0)
MCV: 87.1 fL (ref 80.0–100.0)
MPV: 10 fL (ref 7.5–12.5)
Monocytes Relative: 5.4 %
Neutro Abs: 4935 cells/uL (ref 1500–7800)
Neutrophils Relative %: 70.5 %
Platelets: 189 10*3/uL (ref 140–400)
RBC: 5.21 10*6/uL (ref 4.20–5.80)
RDW: 12.7 % (ref 11.0–15.0)
Total Lymphocyte: 23.7 %
WBC: 7 10*3/uL (ref 3.8–10.8)

## 2018-11-20 NOTE — Patient Instructions (Addendum)
Thank you for coming in today. Use the scaphoid pads in your shoes. You can also use over the counter insoles with good arch support.  Get labs today.  Use the elastic band. Make your foot go up  Do about 30 reps 2-3 x daily.  Recheck with me in 4 weeks.  Return sooner if needed.   Reasonable to consider compression stocking as well.

## 2018-11-20 NOTE — Progress Notes (Signed)
Subjective:    I'm seeing this patient as a consultation for:  Joshua Stade, PA-C   CC: Left leg pain.  HPI: Patient was seen by PCP on August 21.  At that time he been experiencing left leg and shin pain swelling and numbness and tingling.  X-rays showed diffuse nonspecific cortical thickening of fibula and he proceeded with MRI tibia fibula on September 1 which showed nonenhancing cortical thickening mid to distal fibular shaft associated with intramedullary reactive marrow edema consistent with chronic stress reaction.   Patient notes a small nodule at the anterior aspect of his left shin in the muscle belly.  He also notes some numbness and tingling into the dorsal foot and second toe.  Symptoms have been present for about 4 weeks.  He cannot recall any injury or change in activity.  No new medication changes.  He is had a little over-the-counter medicines for treatment which have not helped much.  Symptoms are moderate to mild.  He denies any weakness or loss of function or limping.     Past medical history, Surgical history, Family history not pertinant except as noted below, Social history, Allergies, and medications have been entered into the medical record, reviewed, and no changes needed.   Review of Systems: No headache, visual changes, nausea, vomiting, diarrhea, constipation, dizziness, abdominal pain, skin rash, fevers, chills, night sweats, weight loss, swollen lymph nodes, body aches, joint swelling, muscle aches, chest pain, shortness of breath, mood changes, visual or auditory hallucinations.   Objective:    Vitals:   11/20/18 1500  BP: 122/71  Pulse: (!) 120  Temp: 98.3 F (36.8 C)   General: Well Developed, well nourished, and in no acute distress.  Neuro/Psych: Alert and oriented x3, extra-ocular muscles intact, able to move all 4 extremities, sensation grossly intact. Skin: Warm and dry, no rashes noted.  Respiratory: Not using accessory muscles, speaking in  full sentences, trachea midline.  Cardiovascular: Pulses palpable, no extremity edema. Abdomen: Does not appear distended. MSK: Left leg normal-appearing with no deformity or swelling. Patient has a little bit of tenderness with palpation overlying the muscle belly of the tibialis anterior muscle group.  No palpable nodules present. Intact strength to the foot plantarflexion and dorsiflexion. Patient has foot pronation and pes planus with standing.  Normal heel valgus with toe standing.  Lab and Radiology Results No results found for this or any previous visit (from the past 72 hour(s)). Mr Tibia Fibula Left W Wo Contrast  Result Date: 11/19/2018 CLINICAL DATA:  Mass on left shin x-ray, cortical thickening pain and numbness and tingling EXAM: MRI OF LOWER LEFT EXTREMITY WITHOUT AND WITH CONTRAST TECHNIQUE: Multiplanar, multisequence MR imaging of the left was performed both before and after administration of intravenous contrast. CONTRAST:  8 mL Gadavist COMPARISON:  Radiograph November 04, 2018 FINDINGS: Bones/Joint/Cartilage There is cortical thickening seen within the mid to distal fibular shaft. Within this area there is mildly increased T2 intramedullary signal. No area of or abnormal enhancement is seen. No periosteal edema. No abnormal enhancement or osseous lesion is seen within this region. The remainder of the osseous structures are normal in appearance without evidence of edema or a vascular necrosis. No osseous fracture seen. Ligaments Suboptimally visualized. Muscles and Tendons The muscles are normal in appearance without evidence of edema or atrophy. The tendons are intact. Soft tissues No focal soft tissue swelling or soft tissue mass. Normal enhancement seen throughout the soft tissues. No large knee joint effusion. IMPRESSION:  Nonenhancing cortical thickening seen within the mid to distal fibular shaft as on the recent radiograph. There is associated minimally increased intramedullary  reactive marrow within this region, and this could be from chronic stress reaction. No enhancing osseous lesion or soft tissue mass. Electronically Signed   By: Jonna ClarkBindu  Avutu M.D.   On: 11/19/2018 09:00  I personally (independently) visualized and performed the interpretation of the images attached in this note.  Limited musculoskeletal ultrasound left tibialis anterior muscle belly normal-appearing with no defects or changes.  Normal bony structures. Normal ultrasound.   Impression and Recommendations:    Assessment and Plan: 23 y.o. male with  Left leg nodule and foot numbness and tingling present for a few weeks now.  X-ray showed some cortical irregularity of the fibula and MRI showed some bone edema however he does not have any symptoms in this area.  I think this changes are likely not related to his underlying pain.  I suspect the main issue is probably nerve irritation likely from overuse of the tibialis anterior muscle group.  He does have foot pronation and I suspect with gait he has to use his foot dorsiflexors more than usual thereby irritating the nerve in that area.  Proceed with further metabolic work-up including B12 and vitamin D to follow-up the bone irritation and nerve issues identified.  Also well treat empirically with scaphoid pads to correct foot pronation, recommend calf compression stockings, and home exercise program.  Recheck back in 4 weeks.  If not better would consider physical therapy and possible nerve conduction study.  Ultimately may benefit from exertional compartment pressure testing.  Flu vaccine given today.  PDMP not reviewed this encounter. Orders Placed This Encounter  Procedures  . Flu Vaccine QUAD 6+ mos PF IM (Fluarix Quad PF)  . VITAMIN D 25 Hydroxy (Vit-D Deficiency, Fractures)  . Vitamin B12  . CBC with Differential   No orders of the defined types were placed in this encounter.   Discussed warning signs or symptoms. Please see discharge  instructions. Patient expresses understanding.

## 2018-11-20 NOTE — Progress Notes (Signed)
GREAT news is no mass! You continue to have the bone thickening that appears like coming from chronic stress reaction similar to shin splints. I spoke with dr. Georgina Snell here in office who focuses on sports medicine. He wants to see you and discuss some shoe inserts and maybe a calf sleeve that could help with your gait when walking. Are you running or doing any new exercise program? Please schedule with Dr. Georgina Snell.

## 2018-11-21 ENCOUNTER — Encounter: Payer: Self-pay | Admitting: Physician Assistant

## 2018-11-21 ENCOUNTER — Telehealth: Payer: 59 | Admitting: Physician Assistant

## 2018-11-21 LAB — VITAMIN B12: Vitamin B-12: 433 pg/mL (ref 200–1100)

## 2018-11-21 NOTE — Progress Notes (Signed)
Please fax to [facility] in his [place] care teams note.

## 2018-11-21 NOTE — Telephone Encounter (Signed)
Spoke with Pt and scheduled for virtual visit today

## 2018-11-25 ENCOUNTER — Encounter: Payer: Self-pay | Admitting: Family Medicine

## 2018-11-25 ENCOUNTER — Encounter: Payer: Self-pay | Admitting: Cardiology

## 2018-11-25 ENCOUNTER — Ambulatory Visit (INDEPENDENT_AMBULATORY_CARE_PROVIDER_SITE_OTHER): Payer: 59 | Admitting: Cardiology

## 2018-11-25 ENCOUNTER — Other Ambulatory Visit: Payer: Self-pay

## 2018-11-25 VITALS — BP 100/80 | HR 111 | Temp 98.4°F | Ht 71.0 in | Wt 182.0 lb

## 2018-11-25 DIAGNOSIS — Z01812 Encounter for preprocedural laboratory examination: Secondary | ICD-10-CM

## 2018-11-25 DIAGNOSIS — R000 Tachycardia, unspecified: Secondary | ICD-10-CM | POA: Diagnosis not present

## 2018-11-25 DIAGNOSIS — R079 Chest pain, unspecified: Secondary | ICD-10-CM | POA: Diagnosis not present

## 2018-11-25 MED ORDER — METOPROLOL TARTRATE 50 MG PO TABS: ORAL_TABLET | ORAL | 0 refills | Status: DC

## 2018-11-25 NOTE — Patient Instructions (Addendum)
Medication Instructions:  Your physician has recommended you make the following change in your medication:   Metoprolol 50 mg Take  2 tabs (100 mg) 2 hours prior to CT   If you need a refill on your cardiac medications before your next appointment, please call your pharmacy.   Lab work: Your physician recommends that you return for lab work in:   3-7 days prior to CT: BMP  If you have labs (blood work) drawn today and your tests are completely normal, you will receive your results only by: [name]. MyChart Message (if you have MyChart) OR . A paper copy in the mail If you have any lab test that is abnormal or we need to change your treatment, we will call you to review the results.  Testing/Procedures: Your physician has requested that you have cardiac CT. Cardiac computed tomography (CT) is a painless test that uses an x-ray machine to take clear, detailed pictures of your heart. For further information please visit [email or web address] Please follow instruction sheet as given.  Your cardiac CT will be scheduled at one of the below locations:   [facility] [address] [place], [place] [address] [phone]  Please [facility] the [facility] main [facility] of [facility]  30-45 minutes prior to test start time. Proceed to the [facility] Radiology Department (first floor) to check-in and test prep.  Please follow these instructions carefully (unless otherwise directed):  Hold all erectile dysfunction medications at least 48 hours prior to test.  On the Night Before the Test: . Be sure to Drink plenty of water. . Do not consume any caffeinated/decaffeinated beverages or chocolate 12 hours prior to your test. . Do not take any antihistamines 12 hours prior to your test.  On the Day of the Test: . Drink plenty of water. Do not drink any water within one hour of the test. . Do not eat any food 4 hours prior to the test. . You may take your regular medications  prior to the test.  . Take metoprolol (Lopressor) two hours prior to test                 -If HR is less than 55 BPM- No Beta Blocker                -IF HR is greater than 55 BPM and patient is less than or equal to 24 yrs old Lopressor 100mg  x1.      After the Test: . Drink plenty of water. . After receiving IV contrast, you may experience a mild flushed feeling. This is normal. . On occasion, you may experience a mild rash up to 24 hours after the test. This is not dangerous. If this occurs, you can take Benadryl 25 mg and increase your fluid intake. . If you experience trouble breathing, this can be serious. If it is severe call 911 IMMEDIATELY. If it is mild, please call our office. . If you take any of these medications: Glipizide/Metformin, Avandament, Glucavance, please do not take 48 hours after completing test.    Please contact the cardiac imaging nurse navigator should you have any questions/concerns Rockwell AlexandriaSara Wallace, RN Navigator Cardiac Imaging [facility] [phone] Office  [phone] Cell     Follow-Up: At [facility], you and your health needs are our priority.  As part of our continuing mission to provide you with exceptional heart care, we have created designated Provider Care Teams.  These Care  Teams include your primary Cardiologist (physician) and Advanced Practice Providers (APPs -  Physician Assistants and Nurse Practitioners) who all work together to provide you with the care you need, when you need it. You will need a follow up appointment in 1 months.  Please call our office 2 months in advance to schedule this appointment.  You may see Thomasene Ripple, DOAny Other Special Instructions Will Be Listed Below (If Applicable).   Cardiac CT Angiogram  A cardiac CT angiogram is a procedure to look at the heart and the area around the heart. It may be done to help find the cause of chest pains or other symptoms of heart disease. During this  procedure, a large X-ray machine, called a CT scanner, takes detailed pictures of the heart and the surrounding area after a dye (contrast material) has been injected into blood vessels in the area. The procedure is also sometimes called a coronary CT angiogram, coronary artery scanning, or CTA. A cardiac CT angiogram allows the health care provider to see how well blood is flowing to and from the heart. The health care provider will be able to see if there are any problems, such as:  Blockage or narrowing of the coronary arteries in the heart.  Fluid around the heart.  Signs of weakness or disease in the muscles, valves, and tissues of the heart. Tell a health care provider about:  Any allergies you have. This is especially important if you have had a previous allergic reaction to contrast dye.  All medicines you are taking, including vitamins, herbs, eye drops, creams, and over-the-counter medicines.  Any blood disorders you have.  Any surgeries you have had.  Any medical conditions you have.  Whether you are pregnant or may be pregnant.  Any anxiety disorders, chronic pain, or other conditions you have that may increase your stress or prevent you from lying still. What are the risks? Generally, this is a safe procedure. However, problems may occur, including:  Bleeding.  Infection.  Allergic reactions to medicines or dyes.  Damage to other structures or organs.  Kidney damage from the dye or contrast that is used.  Increased risk of cancer from radiation exposure. This risk is low. Talk with your health care provider about: ? The risks and benefits of testing. ? How you can receive the lowest dose of radiation. What happens before the procedure?  Wear comfortable clothing and remove any jewelry, glasses, dentures, and hearing aids.  Follow instructions from your health care provider about eating and drinking. This may include: ? For 12 hours before the test - avoid  caffeine. This includes tea, coffee, soda, energy drinks, and diet pills. Drink plenty of water or other fluids that do not have caffeine in them. Being well-hydrated can prevent complications. ? For 4-6 hours before the test - stop eating and drinking. The contrast dye can cause nausea, but this is less likely if your stomach is empty.  Ask your health care provider about changing or stopping your regular medicines. This is especially important if you are taking diabetes medicines, blood thinners, or medicines to treat erectile dysfunction. What happens during the procedure?  Hair on your chest may need to be removed so that small sticky patches called electrodes can be placed on your chest. These will transmit information that helps to monitor your heart during the test.  An IV tube will be inserted into one of your veins.  You might be given a medicine to control your heart  rate during the test. This will help to ensure that good images are obtained.  You will be asked to lie on an exam table. This table will slide in and out of the CT machine during the procedure.  Contrast dye will be injected into the IV tube. You might feel warm, or you may get a metallic taste in your mouth.  You will be given a medicine (nitroglycerin) to relax (dilate) the arteries in your heart.  The table that you are lying on will move into the CT machine tunnel for the scan.  The person running the machine will give you instructions while the scans are being done. You may be asked to: ? Keep your arms above your head. ? Hold your breath. ? Stay very still, even if the table is moving.  When the scanning is complete, you will be moved out of the machine.  The IV tube will be removed. The procedure may vary among health care providers and hospitals. What happens after the procedure?  You might feel warm, or you may get a metallic taste in your mouth from the contrast dye.  You may have a headache from the  nitroglycerin.  After the procedure, drink water or other fluids to wash (flush) the contrast material out of your body.  Contact a health care provider if you have any symptoms of allergy to the contrast. These symptoms include: ? Shortness of breath. ? Rash or hives. ? A racing heartbeat.  Most people can return to their normal activities right after the procedure. Ask your health care provider what activities are safe for you.  It is up to you to get the results of your procedure. Ask your health care provider, or the department that is doing the procedure, when your results will be ready. Summary  A cardiac CT angiogram is a procedure to look at the heart and the area around the heart. It may be done to help find the cause of chest pains or other symptoms of heart disease.  During this procedure, a large X-ray machine, called a CT scanner, takes detailed pictures of the heart and the surrounding area after a dye (contrast material) has been injected into blood vessels in the area.  Ask your health care provider about changing or stopping your regular medicines before the procedure. This is especially important if you are taking diabetes medicines, blood thinners, or medicines to treat erectile dysfunction.  After the procedure, drink water or other fluids to wash (flush) the contrast material out of your body. This information is not intended to replace advice given to you by your health care provider. Make sure you discuss any questions you have with your health care provider. Document Released: 02/16/2008 Document Revised: 02/15/2017 Document Reviewed: 01/23/2016 Elsevier Patient Education  2020 [employer].

## 2018-11-25 NOTE — Progress Notes (Signed)
Cardiology Office Note:    Date:  11/25/2018   ID:  Joshua SchwalbeNathan Huie, DOB 04/04/1995, MRN [id number]  PCP:  Jomarie LongsBreeback, Jade L, PA-C  Cardiologist:  Thomasene RippleKardie Santanna Whitford, DO  Electrophysiologist:  None   Referring MD: Jomarie LongsBreeback, Jade L, PA-C   Chief Complaint  Patient presents with  . Chest Pain  . Tachycardia    History of Present Illness:    Joshua Tyler is a 23 y.o. male with a hx of bipolar disorder, schizoaffective disorder, anxiety who presents today for complaints of chest pain.  The patient reports that his initial chest pain started about 3 years ago.  He describes it as a left-sided sharp pain with no radiation that is abrupt and lasts about 1 to 2 minutes.  He states that there is no radiation however there is associated palpitations.  He quantifies the pain as about a 3 out of 10.  Denies any aggravating or relieving factors.  He states over the last several months the pain has become increasing in frequency but the quality stays the same.  Given the persistence of this pain his primary care physician recommended that he come and see the cardiologist. The patient reports this morning he experienced similar pain but during our encounter he does not have any pain.  Past Medical History:  Diagnosis Date  . Bipolar 1 disorder (HCC)   . 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 Schizoaffective disorder [facility](HCC)     Past Surgical History:  Procedure Laterality Date  . CHOLECYSTECTOMY  2019  . WISDOM TOOTH EXTRACTION      Current Medications: Current Meds  Medication Sig  . ARIPiprazole (ABILIFY) 10 MG tablet Take 10 mg by mouth daily.  [name]. buPROPion (WELLBUTRIN XL) 300 MG 24 hr tablet Take 300 mg by mouth daily.  . clonazePAM (KLONOPIN) 0.5 MG tablet Take 0.5 mg by mouth 2 (two) times daily.  . cloZAPine (CLOZARIL) 100 MG tablet TAKE 4 TABLETS (400 MG TOTAL) BY MOUTH NIGHTLY.  . dicyclomine (BENTYL) 10 MG capsule Take 10 mg by mouth as needed for spasms.  [name]. doxepin (SINEQUAN) 10 MG  capsule Take 3 capsules by mouth at bedtime.  . fluticasone (FLONASE) 50 MCG/ACT nasal spray Place 2 sprays into both nostrils daily.  [name]. lamoTRIgine (LAMICTAL) 200 MG tablet Take 200 mg by mouth daily.  [name]. loratadine (CLARITIN) 10 MG tablet Take 10 mg by mouth daily.  [name]. LORazepam (ATIVAN) 0.5 MG tablet Take 1 tab each morning and night, and 1 additional tablet as needed for anxiety / panic     Allergies:   Patient has no known allergies.   Social History   Socioeconomic History  . Marital status: Married    Spouse name: Not on file  . Number of children: Not on file  . Years of education: Not on file  . Highest education level: Not on file  Occupational History  . Not on file  Social Needs  . Financial resource strain: Not on file  . Food insecurity    Worry: Not on file    Inability: Not on file  . Transportation needs    Medical: Not on file    Non-medical: Not on file  Tobacco Use  . Smoking status: Former Smoker    Quit date: 06/25/2012    Years since quitting: 6.4  . Smokeless tobacco: Never Used  Substance and Sexual Activity  . Alcohol use: Never    Frequency: Never  . Drug use: Not Currently  Types: Marijuana  . Sexual activity: Yes    Birth control/protection: Condom, Pill  Lifestyle  . Physical activity    Days per week: Not on file    Minutes per session: Not on file  . Stress: Not on file  Relationships  . Social Musicianconnections    Talks on phone: Not on file    Gets together: Not on file    Attends religious service: Not on file    Active member of club or organization: Not on file    Attends meetings of clubs or organizations: Not on file    Relationship status: Not on file  Other Topics Concern  . Not on file  Social History Narrative  . Not on file     Family History: The patient's family history includes Cancer in his paternal grandfather; Heart attack in his paternal grandfather; Heart disease in his father.  ROS:    Review of Systems   Constitution: Negative for chills, diaphoresis and fever.  HENT: Negative for ear pain, hearing loss and nosebleeds.   Eyes: Negative for blurred vision, discharge and photophobia.  Cardiovascular: Positive for chest pain and palpitations. Negative for leg swelling and paroxysmal nocturnal dyspnea.  Respiratory: Negative for cough, shortness of breath and sputum production.   Hematologic/Lymphatic: Does not bruise/bleed easily.  Skin: Negative for itching.  Musculoskeletal: Negative for back pain, myalgias and neck pain.  Gastrointestinal: Negative for abdominal pain, blood in stool, heartburn and vomiting.  Genitourinary: Negative for dysuria, frequency and hematuria.  Neurological: Negative for dizziness, headaches, tremors and weakness.  Psychiatric/Behavioral: Positive for depression. Negative for memory loss and substance abuse. The patient is nervous/anxious.     EKGs/Labs/Other Studies Reviewed:    The following studies were reviewed today:  EKG:  The ekg ordered today demonstrates sinus tachycardia, with a heart rate of 110 bpm.   3-day holter monitoring 11/01/2018:  Sinus bradycardia to sinus tachycardia.  No pauses or arrhythmias.  Patient had a min HR of 57 bpm, max HR of 172 bpm, and avg HR of 104 bpm. Predominant underlying rhythm was Sinus Rhythm. No Isolated SVEs, SVE Couplets, or SVE Triplets were present. No Isolated VEs, VE Couplets, or VE Triplets were present.  Completely normal 3-day Holter monitor, symptomatic episodes corresponded with sinus rhythm.  Transthoracic echocardiogram 10/29/2018  IMPRESSIONS     1. The left ventricle has normal systolic function, with an ejection fraction of 55-60%. The cavity size was normal. Left ventricular diastolic parameters were normal.  2. The aorta is normal in size and structure.  3. The average left ventricular global longitudinal strain is -19.5 %.  4. No atrial level shunt detected by color flow Doppler. Agitated  saline contrast was given intravenously to evaluate for intracardiac shunting. Saline contrast bubble study was negative, with no evidence of any interatrial shunt. There is minimal and  very delayed arrival of saline contrast in the left atrium, consistent with pulmonary recirculation rather than intracardiac shunt.    Recent Labs: 05/01/2018: Brain Natriuretic Peptide <4; TSH 1.63 10/27/2018: ALT 19; BUN 14; Creat 1.01; Potassium 4.1; Sodium 142 11/20/2018: Hemoglobin 15.8; Platelets 189  Recent Lipid Panel No results found for: CHOL, TRIG, HDL, CHOLHDL, VLDL, LDLCALC, LDLDIRECT  Physical Exam:    VS:  BP 100/80 (BP Location: Right Arm, Patient Position: Sitting, Cuff Size: Normal)   Pulse (!) 111   Temp 98.4 F (36.9 C)   Ht 5\' 11"  (1.803 m)   Wt 182 lb (82.6 kg)   SpO2  98%   BMI 25.38 kg/m     Wt Readings from Last 3 Encounters:  11/25/18 182 lb (82.6 kg)  11/20/18 184 lb (83.5 kg)  11/07/18 181 lb (82.1 kg)     GEN:  Well nourished, well developed in no acute distress HEENT: Normal NECK: No JVD; No carotid bruits LYMPHATICS: No lymphadenopathy CARDIAC: S1,S2 noted, RRR, no murmurs, rubs, gallops RESPIRATORY:  Clear to auscultation without rales, wheezing or rhonchi  ABDOMEN: Soft, non-tender, non-distended EXTREMITIES: no edema, no cyanosis, no clubbing MUSCULOSKELETAL:  No edema; No deformity  SKIN: Warm and dry NEUROLOGIC:  Alert and oriented x 3 PSYCHIATRIC:  Normal affect   ASSESSMENT:    1. Pre-procedure lab exam   2. Chest pain, unspecified type   3. Sinus tachycardia    PLAN:    Mr. Baumgart presented for a office today due to chest pain. During this visit, I reviewed the results from his recent holter monitor and TTE. Both of these are normal.   He continues to experience chest pain. For this cardiac CT is appropriate to assess for other structural abnormalities that may not be visualized on his echo. This will give [name] more information to make appropriate  treatment plan for this patient.   His reported palpitations which I do believe is due to sinus tachycardia. His sinus tachycardia is potential in the setting of his anxiety. I have discussed with the patient and his wife the need for optimizing his medication regimen for his anxiety. He plans to see his psychiatrist on October 2, at this which time he will be discussing this.   The patient is in agreement with the above plan. The plan was also discussed with his wife Lorelle Formosa on the phone. He will follow up in 1 month. The patient left the office in stable condition.   Disposition:   Medication Adjustments/Labs and Tests Ordered: Current medicines are reviewed at length with the patient today.  Concerns regarding medicines are outlined above.  Orders Placed This Encounter  Procedures  . CT CORONARY FRACTIONAL FLOW RESERVE DATA PREP  . CT CORONARY FRACTIONAL FLOW RESERVE FLUID ANALYSIS  . CT CORONARY MORPH W/CTA COR W/SCORE W/CA W/CM &/OR WO/CM  . Basic Metabolic Panel (BMET)  . EKG 12-Lead   Meds ordered this encounter  Medications  . metoprolol tartrate (LOPRESSOR) 50 MG tablet    Sig: Take 2 tabs (100mg s) 2 hours prior to CT    Dispense:  2 tablet    Refill:  0    Patient Instructions  Medication Instructions:  Your physician has recommended you make the following change in your medication:   Metoprolol 50 mg Take  2 tabs (100 mg) 2 hours prior to CT   If you need a refill on your cardiac medications before your next appointment, please call your pharmacy.   Lab work: Your physician recommends that you return for lab work in:   3-7 days prior to CT: BMP  If you have labs (blood work) drawn today and your tests are completely normal, you will receive your results only by: [name] MyChart Message (if you have MyChart) OR . A paper copy in the mail If you have any lab test that is abnormal or we need to change your treatment, we will call you to review the  results.  Testing/Procedures: Your physician has requested that you have cardiac CT. Cardiac computed tomography (CT) is a painless test that uses an x-ray machine to take clear, detailed pictures of your  heart. For further information please visit [email or web address] Please follow instruction sheet as given.  Your cardiac CT will be scheduled at one of the below locations:   [facility] [address] [place], [place] [address] [phone]  Please arrive at the [facility] main entrance of [facility] 30-45 minutes prior to test start time. Proceed to the [facility] Radiology Department (first floor) to check-in and test prep.  Please follow these instructions carefully (unless otherwise directed):  Hold all erectile dysfunction medications at least 48 hours prior to test.  On the Night Before the Test: . Be sure to Drink plenty of water. . Do not consume any caffeinated/decaffeinated beverages or chocolate 12 hours prior to your test. . Do not take any antihistamines 12 hours prior to your test.  On the Day of the Test: . Drink plenty of water. Do not drink any water within one hour of the test. . Do not eat any food 4 hours prior to the test. . You may take your regular medications prior to the test.  . Take metoprolol (Lopressor) two hours prior to test                 -If HR is less than 55 BPM- No Beta Blocker                -IF HR is greater than 55 BPM and patient is less than or equal to 89 yrs old Lopressor 100mg  x1.      After the Test: . Drink plenty of water. . After receiving IV contrast, you may experience a mild flushed feeling. This is normal. . On occasion, you may experience a mild rash up to 24 hours after the test. This is not dangerous. If this occurs, you can take Benadryl 25 mg and increase your fluid intake. . If you experience trouble breathing, this can be serious. If it is severe call 911 IMMEDIATELY. If it is mild, please call  our office. . If you take any of these medications: Glipizide/Metformin, Avandament, Glucavance, please do not take 48 hours after completing test.    Please contact the cardiac imaging nurse navigator should you have any questions/concerns Rockwell Alexandria, RN Navigator Cardiac Imaging [facility] [phone] Office  [phone] Cell     Follow-Up: At [facility], you and your health needs are our priority.  As part of our continuing mission to provide you with exceptional heart care, we have created designated Provider Care Teams.  These Care Teams include your primary Cardiologist (physician) and Advanced Practice Providers (APPs -  Physician Assistants and Nurse Practitioners) who all work together to provide you with the care you need, when you need it. You will need a follow up appointment in 1 months.  Please call our office 2 months in advance to schedule this appointment.  You may see Thomasene Ripple, DOAny Other Special Instructions Will Be Listed Below (If Applicable).   Cardiac CT Angiogram  A cardiac CT angiogram is a procedure to look at the heart and the area around the heart. It may be done to help find the cause of chest pains or other symptoms of heart disease. During this procedure, a large X-ray machine, called a CT scanner, takes detailed pictures of the heart and the surrounding area after a dye (contrast material) has been injected into blood vessels in the area. The procedure is also sometimes called a coronary CT angiogram, coronary artery scanning, or  CTA. A cardiac CT angiogram allows the health care provider to see how well blood is flowing to and from the heart. The health care provider will be able to see if there are any problems, such as:  Blockage or narrowing of the coronary arteries in the heart.  Fluid around the heart.  Signs of weakness or disease in the muscles, valves, and tissues of the heart. Tell a health care provider  about:  Any allergies you have. This is especially important if you have had a previous allergic reaction to contrast dye.  All medicines you are taking, including vitamins, herbs, eye drops, creams, and over-the-counter medicines.  Any blood disorders you have.  Any surgeries you have had.  Any medical conditions you have.  Whether you are pregnant or may be pregnant.  Any anxiety disorders, chronic pain, or other conditions you have that may increase your stress or prevent you from lying still. What are the risks? Generally, this is a safe procedure. However, problems may occur, including:  Bleeding.  Infection.  Allergic reactions to medicines or dyes.  Damage to other structures or organs.  Kidney damage from the dye or contrast that is used.  Increased risk of cancer from radiation exposure. This risk is low. Talk with your health care provider about: ? The risks and benefits of testing. ? How you can receive the lowest dose of radiation. What happens before the procedure?  Wear comfortable clothing and remove any jewelry, glasses, dentures, and hearing aids.  Follow instructions from your health care provider about eating and drinking. This may include: ? For 12 hours before the test - avoid caffeine. This includes tea, coffee, soda, energy drinks, and diet pills. Drink plenty of water or other fluids that do not have caffeine in them. Being well-hydrated can prevent complications. ? For 4-6 hours before the test - stop eating and drinking. The contrast dye can cause nausea, but this is less likely if your stomach is empty.  Ask your health care provider about changing or stopping your regular medicines. This is especially important if you are taking diabetes medicines, blood thinners, or medicines to treat erectile dysfunction. What happens during the procedure?  Hair on your chest may need to be removed so that small sticky patches called electrodes can be placed on  your chest. These will transmit information that helps to monitor your heart during the test.  An IV tube will be inserted into one of your veins.  You might be given a medicine to control your heart rate during the test. This will help to ensure that good images are obtained.  You will be asked to lie on an exam table. This table will slide in and out of the CT machine during the procedure.  Contrast dye will be injected into the IV tube. You might feel warm, or you may get a metallic taste in your mouth.  You will be given a medicine (nitroglycerin) to relax (dilate) the arteries in your heart.  The table that you are lying on will move into the CT machine tunnel for the scan.  The person running the machine will give you instructions while the scans are being done. You may be asked to: ? Keep your arms above your head. ? Hold your breath. ? Stay very still, even if the table is moving.  When the scanning is complete, you will be moved out of the machine.  The IV tube will be removed. The procedure may vary  among health care providers and hospitals. What happens after the procedure?  You might feel warm, or you may get a metallic taste in your mouth from the contrast dye.  You may have a headache from the nitroglycerin.  After the procedure, drink water or other fluids to wash (flush) the contrast material out of your body.  Contact a health care provider if you have any symptoms of allergy to the contrast. These symptoms include: ? Shortness of breath. ? Rash or hives. ? A racing heartbeat.  Most people can return to their normal activities right after the procedure. Ask your health care provider what activities are safe for you.  It is up to you to get the results of your procedure. Ask your health care provider, or the department that is doing the procedure, when your results will be ready. Summary  A cardiac CT angiogram is a procedure to look at the heart and the area  around the heart. It may be done to help find the cause of chest pains or other symptoms of heart disease.  During this procedure, a large X-ray machine, called a CT scanner, takes detailed pictures of the heart and the surrounding area after a dye (contrast material) has been injected into blood vessels in the area.  Ask your health care provider about changing or stopping your regular medicines before the procedure. This is especially important if you are taking diabetes medicines, blood thinners, or medicines to treat erectile dysfunction.  After the procedure, drink water or other fluids to wash (flush) the contrast material out of your body. This information is not intended to replace advice given to you by your health care provider. Make sure you discuss any questions you have with your health care provider. Document Released: 02/16/2008 Document Revised: 02/15/2017 Document Reviewed: 01/23/2016 Elsevier Patient Education  [address]       Signed, [place] Deveon Kisiel, DO  11/25/2018 7:00 PM    [facility]

## 2018-12-22 ENCOUNTER — Ambulatory Visit: Payer: 59 | Admitting: Family Medicine

## 2018-12-23 ENCOUNTER — Ambulatory Visit: Payer: 59 | Admitting: Cardiology

## 2019-02-26 ENCOUNTER — Other Ambulatory Visit: Payer: Self-pay

## 2019-02-26 DIAGNOSIS — Z79899 Other long term (current) drug therapy: Secondary | ICD-10-CM

## 2019-02-26 LAB — CBC WITH DIFFERENTIAL/PLATELET
Absolute Monocytes: 524 cells/uL (ref 200–950)
Basophils Absolute: 30 cells/uL (ref 0–200)
Basophils Relative: 0.4 %
Eosinophils Absolute: 160 cells/uL (ref 15–500)
Eosinophils Relative: 2.1 %
HCT: 48.1 % (ref 38.5–50.0)
Hemoglobin: 17 g/dL (ref 13.2–17.1)
Lymphs Abs: 2212 cells/uL (ref 850–3900)
MCH: 30.8 pg (ref 27.0–33.0)
MCHC: 35.3 g/dL (ref 32.0–36.0)
MCV: 87.1 fL (ref 80.0–100.0)
MPV: 10.8 fL (ref 7.5–12.5)
Monocytes Relative: 6.9 %
Neutro Abs: 4674 cells/uL (ref 1500–7800)
Neutrophils Relative %: 61.5 %
Platelets: 216 10*3/uL (ref 140–400)
RBC: 5.52 10*6/uL (ref 4.20–5.80)
RDW: 12.9 % (ref 11.0–15.0)
Total Lymphocyte: 29.1 %
WBC: 7.6 10*3/uL (ref 3.8–10.8)

## 2019-02-27 NOTE — Progress Notes (Signed)
Labs stable. Please send to [facility] on file for his recurrent lab update.

## 2019-03-17 ENCOUNTER — Other Ambulatory Visit: Payer: Self-pay

## 2019-03-17 ENCOUNTER — Ambulatory Visit (INDEPENDENT_AMBULATORY_CARE_PROVIDER_SITE_OTHER): Payer: 59 | Admitting: Physician Assistant

## 2019-03-17 ENCOUNTER — Encounter: Payer: Self-pay | Admitting: Physician Assistant

## 2019-03-17 VITALS — BP 117/76 | HR 115 | Ht 72.0 in | Wt 186.0 lb

## 2019-03-17 DIAGNOSIS — N410 Acute prostatitis: Secondary | ICD-10-CM

## 2019-03-17 DIAGNOSIS — N5312 Painful ejaculation: Secondary | ICD-10-CM | POA: Diagnosis not present

## 2019-03-17 LAB — POCT URINALYSIS DIP (CLINITEK)
Bilirubin, UA: NEGATIVE
Blood, UA: NEGATIVE
Glucose, UA: NEGATIVE mg/dL
Ketones, POC UA: NEGATIVE mg/dL
Leukocytes, UA: NEGATIVE
Nitrite, UA: NEGATIVE
Spec Grav, UA: 1.025 (ref 1.010–1.025)
Urobilinogen, UA: 0.2 E.U./dL
pH, UA: 7.5 (ref 5.0–8.0)

## 2019-03-17 MED ORDER — PREDNISONE 50 MG PO TABS: 50.0000 mg | ORAL_TABLET | Freq: Every day | ORAL | 0 refills | Status: DC

## 2019-03-17 MED ORDER — SULFAMETHOXAZOLE-TRIMETHOPRIM 800-160 MG PO TABS: 1.0000 | ORAL_TABLET | Freq: Two times a day (BID) | ORAL | 0 refills | Status: DC

## 2019-03-17 NOTE — Patient Instructions (Signed)

## 2019-03-17 NOTE — Progress Notes (Signed)
Subjective:    Patient ID: Joshua Tyler, male    DOB: 12/19/1995, 23 y.o.   MRN: [id number]  HPI  Patient is a 23 year old male with bipolar 1 disorder who presents to the clinic with some ejaculation concerns.  Patient has noticed within the last 3 months that his semen has been less quantity and clear than normal.  At times he has noticed some discomfort with ejaculation.  He denies any urinary symptoms.  He denies any pain with urination.  He denies any lower abdominal or flank pain.  He has had this problem once before and was given prednisone and it seemed to clear up for many years.  He denies any fever, chills, nausea, vomiting.  He is not getting up at night to urinate and denies any frequent urination during the day.  He has not had any medication changes in the last 2 to 3 months. Pat grandfather prostate cancer.  Patient denies any constipation or diarrhea.  Patient denies any melena or hematochezia.  .. Family History  Problem Relation Age of Onset  . Heart disease Father   . Heart attack Paternal Grandfather   . Cancer Paternal Grandfather        colon and prostate       Review of Systems See HPI.     Objective:   Physical Exam Vitals reviewed.  Constitutional:      Appearance: Normal appearance.  Cardiovascular:     Rate and Rhythm: Normal rate and regular rhythm.     Pulses: Normal pulses.  Pulmonary:     Effort: Pulmonary effort is normal.  Abdominal:     General: Abdomen is flat.     Palpations: Abdomen is soft.     Tenderness: There is no abdominal tenderness. There is no right CVA tenderness or left CVA tenderness.  Genitourinary:    Comments: Enlarged and tender prostate.  Neurological:     General: No focal deficit present.     Mental Status: He is alert.  Psychiatric:        Mood and Affect: Mood normal.       .. Results for orders placed or performed in visit on 03/17/19  Urine Culture   Specimen: Urine  Result Value Ref Range   MICRO  NUMBER: [id number]    SPECIMEN QUALITY: Adequate    Sample Source NOT GIVEN    STATUS: FINAL    Result: No Growth   PSA  Result Value Ref Range   PSA 0.7 < OR = 4.0 ng/mL  BASIC METABOLIC PANEL WITH GFR  Result Value Ref Range   Glucose, Bld 96 65 - 99 mg/dL   BUN 14 7 - 25 mg/dL   Creat 1.03 0.60 - 1.35 mg/dL   GFR, Est Non African American 101 > OR = 60 mL/min/1.71m2   GFR, Est African American 117 > OR = 60 mL/min/1.69m2   BUN/Creatinine Ratio NOT APPLICABLE 6 - 22 (calc)   Sodium 143 135 - 146 mmol/L   Potassium 4.0 3.5 - 5.3 mmol/L   Chloride 105 98 - 110 mmol/L   CO2 28 20 - 32 mmol/L   Calcium 9.7 8.6 - 10.3 mg/dL  POCT URINALYSIS DIP (CLINITEK)  Result Value Ref Range   Color, UA yellow yellow   Clarity, UA clear clear   Glucose, UA negative negative mg/dL   Bilirubin, UA negative negative   Ketones, POC UA negative negative mg/dL   Spec Grav, UA 1.025 1.010 - 1.025   Blood,  UA negative negative   pH, UA 7.5 5.0 - 8.0   POC PROTEIN,UA trace negative, trace   Urobilinogen, UA 0.2 0.2 or 1.0 E.U./dL   Nitrite, UA Negative Negative   Leukocytes, UA Negative Negative       Assessment & Plan:  Marland KitchenMarland KitchenJsean was seen today for sexual problem.  Diagnoses and all orders for this visit:  Acute prostatitis -     predniSONE (DELTASONE) 50 MG tablet; Take 1 tablet (50 mg total) by mouth daily. -     sulfamethoxazole-trimethoprim (BACTRIM DS) 800-160 MG tablet; Take 1 tablet by mouth 2 (two) times daily. -     Urine Culture  Painful ejaculation -     POCT URINALYSIS DIP (CLINITEK) -     PSA -     BASIC METABOLIC PANEL WITH GFR -     Urine Culture   DRE with palpation of the prostate was tender as well as enlarged today.  We will treat for prostatitis with Bactrim due to interaction with Cipro with other medications that he is on.  In the past prednisone helped his symptoms.  We will add a burst of prednisone.  We will check PSA, BMP. cBC was recently checked a few days ago  and WNL. His urine dipstick today did not show any bacteria.  Will culture.follow up if symptoms are changing or not improving.

## 2019-03-18 LAB — BASIC METABOLIC PANEL WITH GFR
BUN: 14 mg/dL (ref 7–25)
CO2: 28 mmol/L (ref 20–32)
Calcium: 9.7 mg/dL (ref 8.6–10.3)
Chloride: 105 mmol/L (ref 98–110)
Creat: 1.03 mg/dL (ref 0.60–1.35)
GFR, Est African American: 117 mL/min/{1.73_m2} (ref >=60)
GFR, Est Non African American: 101 mL/min/{1.73_m2} (ref >=60)
Glucose, Bld: 96 mg/dL (ref 65–99)
Potassium: 4 mmol/L (ref 3.5–5.3)
Sodium: 143 mmol/L (ref 135–146)

## 2019-03-18 LAB — PSA: PSA: 0.7 ng/mL (ref <=4.0)

## 2019-03-18 NOTE — Progress Notes (Signed)
Eriq,   Prostate enzymes are normal. Kidney function looks great.   Treatment plan stays the same.   -Luvenia Starch

## 2019-03-19 LAB — URINE CULTURE
MICRO NUMBER:: 1237308
Result:: NO GROWTH
SPECIMEN QUALITY:: ADEQUATE

## 2019-03-19 NOTE — Progress Notes (Signed)
[name],   No bacteria shown in urine. How are your symptoms today?   -Joshua Tyler

## 2019-04-02 ENCOUNTER — Encounter: Payer: Self-pay | Admitting: Physician Assistant

## 2019-04-02 ENCOUNTER — Ambulatory Visit (INDEPENDENT_AMBULATORY_CARE_PROVIDER_SITE_OTHER): Payer: 59 | Admitting: Physician Assistant

## 2019-04-02 VITALS — Temp 100.1°F | Ht 72.0 in | Wt 186.0 lb

## 2019-04-02 DIAGNOSIS — R195 Other fecal abnormalities: Secondary | ICD-10-CM | POA: Diagnosis not present

## 2019-04-02 DIAGNOSIS — B349 Viral infection, unspecified: Secondary | ICD-10-CM

## 2019-04-02 DIAGNOSIS — R509 Fever, unspecified: Secondary | ICD-10-CM

## 2019-04-02 DIAGNOSIS — R519 Headache, unspecified: Secondary | ICD-10-CM

## 2019-04-02 DIAGNOSIS — R52 Pain, unspecified: Secondary | ICD-10-CM

## 2019-04-02 DIAGNOSIS — R110 Nausea: Secondary | ICD-10-CM

## 2019-04-02 MED ORDER — METHYLPREDNISOLONE 4 MG PO TBPK: ORAL_TABLET | ORAL | 0 refills | Status: DC

## 2019-04-02 MED ORDER — PROMETHAZINE HCL 25 MG PO TABS: 25.0000 mg | ORAL_TABLET | Freq: Four times a day (QID) | ORAL | 0 refills | Status: DC | PRN

## 2019-04-02 NOTE — Progress Notes (Signed)
Migraine - started 4 days ago, not going away, nausea/no vomiting, sensitivity to sound, blurry vision  Body aches - started 2 days ago, fever 2 days ago (100), chills  Has cough/congestion  No sore throat  Has been taking ibuprofen for symptoms

## 2019-04-02 NOTE — Progress Notes (Addendum)
Patient ID: Joshua Tyler, male   DOB: [birth year], 24 y.o.   MRN: [id number] .Marland KitchenVirtual Visit via Video Note  I connected with [name] on 04/02/19 at  9:30 AM EST by a video enabled telemedicine application and verified that I am speaking with the correct person using two identifiers.  Location: Patient: home Provider: clinic   I discussed the limitations of evaluation and management by telemedicine and the availability of in person appointments. The patient expressed understanding and agreed to proceed.  History of Present Illness: Patient is a 24 year old male who comes into the clinic with headache, body aches, fever, dry cough, congestion and no appetite for the last 4 days.  He denies any direct contact with Covid.  He denies any household sick contacts.  His worst symptom is his headache that is almost a migraine.  It is very persistent.  Ibuprofen does help a little.  He is very nauseated but no vomiting.  He is sensitive to sound and his vision is blurry.  He has mild sinus pressure and congestion with mild sore throat.  He denies any problems breathing or shortness of breath.  He denies any loss of smell or taste.  He did have an episode of loose stool.  He was tested yesterday at [facility] for Covid and does not have the results. Pt is requesting something for the headache.    .. Active Ambulatory Problems    Diagnosis Date Noted  . Bipolar 1 disorder (HCC) 07/21/2017  . Schizoaffective disorder (HCC) 07/21/2017  . Gastroesophageal reflux disease with esophagitis 07/21/2017  . Spleen enlarged 07/30/2017  . Status post cholecystectomy 09/30/2017  . Palpitations 05/01/2018  . Sinus tachycardia by electrocardiogram 10/27/2018  . PFO (patent foramen ovale) 10/28/2018  . Right axis deviation 10/28/2018  . Acute intractable headache 10/28/2018  . Abnormality of fibula 11/05/2018  . Bone thickening 11/07/2018  . Lower leg mass, left 11/07/2018  . Pain of left fibula 11/07/2018  . Left  medial tibial stress syndrome 11/20/2018   Resolved Ambulatory Problems    Diagnosis Date Noted  . Dysuria 10/02/2017  . 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    Observations/Objective: No acute distress. Pale appearance.  No labored breathing.   .. Today's Vitals   04/02/19 0822  Temp: 100.1 F (37.8 C)  TempSrc: Oral  Weight: 186 lb (84.4 kg)  Height: 6' (1.829 m)   Body mass index is 25.23 kg/m.    Assessment and Plan: Marland KitchenMarland KitchenRonaldo was seen today for migraine and generalized body aches.  Diagnoses and all orders for this visit:  Viral syndrome -     methylPREDNISolone (MEDROL DOSEPAK) 4 MG TBPK tablet; Take as directed by package insert.  Fever, unspecified fever cause  Acute intractable headache, unspecified headache type -     methylPREDNISolone (MEDROL DOSEPAK) 4 MG TBPK tablet; Take as directed by package insert.  Loose stools  Body aches  Nausea -     promethazine (PHENERGAN) 25 MG tablet; Take 1 tablet (25 mg total) by mouth every 6 (six) hours as needed for nausea or vomiting.   Certainly his symptoms do sound concerning for Covid infection.  He has been tested and instructed to self isolate until his test results come back.  Please let us know the results.  At this point we need to focus on symptomatic treatment.  Since his headache is so bad.  We will give a Medrol  Dosepak and Phenergan.  Patient instructed to rest and hydrate.  He could consider vitamin C and zinc for immune support.  He can continue to take ibuprofen and Tylenol for headache.  Please call office with any breathing difficulty.  For sudden breathing troubles please go to the emergency room.  If he is Covid negative we could consider bring him in the office for other treatment.  Spent 15 minutes with patient and in chart review.  Follow Up Instructions:    I discussed the assessment and treatment plan with the  patient. The patient was provided an opportunity to ask questions and all were answered. The patient agreed with the plan and demonstrated an understanding of the instructions.   The patient was advised to call back or seek an in-person evaluation if the symptoms worsen or if the condition fails to improve as anticipated.   [name], PA-C

## 2019-04-03 ENCOUNTER — Other Ambulatory Visit: Payer: Self-pay

## 2019-04-03 ENCOUNTER — Encounter: Payer: Self-pay | Admitting: Emergency Medicine

## 2019-04-03 ENCOUNTER — Emergency Department (INDEPENDENT_AMBULATORY_CARE_PROVIDER_SITE_OTHER): Payer: 59

## 2019-04-03 ENCOUNTER — Encounter: Payer: Self-pay | Admitting: Physician Assistant

## 2019-04-03 ENCOUNTER — Emergency Department (INDEPENDENT_AMBULATORY_CARE_PROVIDER_SITE_OTHER)
Admission: EM | Admit: 2019-04-03 | Discharge: 2019-04-03 | Disposition: A | Discharge status: Home / Self Care | Payer: 59 | Source: Home / Self Care | Attending: Family Medicine | Admitting: Family Medicine

## 2019-04-03 DIAGNOSIS — R519 Headache, unspecified: Secondary | ICD-10-CM

## 2019-04-03 MED ORDER — DEXAMETHASONE SODIUM PHOSPHATE 10 MG/ML IJ SOLN
10.0000 mg | Freq: Once | INTRAMUSCULAR | Status: AC
  Administered 2019-04-03: 10 mg via INTRAMUSCULAR

## 2019-04-03 MED ORDER — KETOROLAC TROMETHAMINE 60 MG/2ML IM SOLN
60.0000 mg | Freq: Once | INTRAMUSCULAR | Status: AC
  Administered 2019-04-03: 60 mg via INTRAMUSCULAR

## 2019-04-03 NOTE — Discharge Instructions (Addendum)
Resume prednisone Saturday 04/04/19.  May continue Phenergan for nausea. Add Pseudoephedrine (30mg , one or two every 4 to 6 hours) for sinus congestion.   May use Afrin nasal spray (or generic oxymetazoline) each morning for about 5 days and then discontinue.  Also recommend using saline nasal spray several times daily and saline nasal irrigation (AYR is a common brand).  Use Flonase nasal spray each morning after using Afrin nasal spray and saline nasal irrigation.

## 2019-04-03 NOTE — ED Triage Notes (Signed)
Patient started with a migraine 5 days ago, body aches, productive cough, had a COVID test 5 days ago, results are negative, unsure if he has a sinus infection.  Having a lot of drainage, taking prednisone.

## 2019-04-03 NOTE — ED Provider Notes (Signed)
[facility]    CSN: [id number] Arrival date & time: 04/03/19  1537      History   Chief Complaint Chief Complaint  Patient presents with  . Headache    HPI Quintin Hjort is a 24 y.o. male.   Five days ago patient developed a migraine headache, fever/chills, productive cough and sinus congestion.  He had a negative COVID19 test.  His symptoms improved except for persistent frontal headache and sinus congestion.  He continues to have occasional nausea improved with Phenergan.  He has a history of seasonal rhinitis and is presently taking a tapering course of prednisone.  The history is provided by the patient.    Past Medical History:  Diagnosis Date  . Bipolar 1 disorder (HCC)   . Left to right cardiac shunt 10/28/2018  . Schizoaffective disorder [facility])     Patient Active Problem List   Diagnosis Date Noted  . Left medial tibial stress syndrome 11/20/2018  . Bone thickening 11/07/2018  . Lower leg mass, left 11/07/2018  . Pain of left fibula 11/07/2018  . Abnormality of fibula 11/05/2018  . PFO (patent foramen ovale) 10/28/2018  . Right axis deviation 10/28/2018  . Acute intractable headache 10/28/2018  . Sinus tachycardia by electrocardiogram 10/27/2018  . Palpitations 05/01/2018  . Status post cholecystectomy 09/30/2017  . Spleen enlarged 07/30/2017  . Bipolar 1 disorder (HCC) 07/21/2017  . Schizoaffective disorder (HCC) 07/21/2017  . Gastroesophageal reflux disease with esophagitis 07/21/2017    Past Surgical History:  Procedure Laterality Date  . CHOLECYSTECTOMY  2019  . WISDOM TOOTH EXTRACTION         Home Medications    Prior to Admission medications   Medication Sig Start Date End Date Taking? Authorizing Provider  ARIPiprazole (ABILIFY) 10 MG tablet Take 10 mg by mouth daily.   Yes [provider]  buPROPion (WELLBUTRIN XL) 300 MG 24 hr tablet Take 300 mg by mouth daily.   Yes [provider]  clonazePAM (KLONOPIN) 0.5  MG tablet Take 0.5 mg by mouth 2 (two) times daily.   Yes [provider]  cloZAPine (CLOZARIL) 100 MG tablet TAKE 4 TABLETS (400 MG TOTAL) BY MOUTH NIGHTLY. 05/20/17  Yes [provider]  dicyclomine (BENTYL) 10 MG capsule Take 10 mg by mouth as needed for spasms.   Yes [provider]  doxepin (SINEQUAN) 10 MG capsule Take 3 capsules by mouth at bedtime. 10/08/18  Yes [provider]  fluticasone (FLONASE) 50 MCG/ACT nasal spray Place 2 sprays into both nostrils daily. 07/14/18  Yes Breeback, Jade L, PA-C  loratadine (CLARITIN) 10 MG tablet Take 10 mg by mouth daily.   Yes [provider]  methylPREDNISolone (MEDROL DOSEPAK) 4 MG TBPK tablet Take as directed by package insert. 04/02/19  Yes Breeback, Jade L, PA-C  promethazine (PHENERGAN) 25 MG tablet Take 1 tablet (25 mg total) by mouth every 6 (six) hours as needed for nausea or vomiting. 04/02/19  Yes Breeback, Jade L, PA-C  lamoTRIgine (LAMICTAL) 200 MG tablet Take 200 mg by mouth daily. 04/12/17 04/02/19  [provider]    Family History Family History  Problem Relation Age of Onset  . Heart disease Father   . Heart attack Paternal Grandfather   . Cancer Paternal Grandfather        colon and prostate    Social History Social History   Tobacco Use  . Smoking status: Former Smoker    Quit date: 06/25/2012    Years since  quitting: 6.7  . Smokeless tobacco: Never Used  Substance Use Topics  . Alcohol use: Never  . Drug use: Not Currently    Types: Marijuana     Allergies   Patient has no known allergies.   Review of Systems Review of Systems No sore throat No cough No pleuritic pain No wheezing + nasal congestion + post-nasal drainage + sinus pain/pressure No itchy/red eyes No earache No hemoptysis No SOB No fever/chills + nausea No vomiting No abdominal pain No diarrhea No urinary symptoms No skin rash + fatigue No myalgias + headache   Physical  Exam Triage Vital Signs ED Triage Vitals  Enc Vitals Group     BP 04/03/19 1803 117/78     Pulse Rate 04/03/19 1803 (!) 112     Resp --      Temp 04/03/19 1803 98.1 F (36.7 C)     Temp Source 04/03/19 1803 Oral     SpO2 04/03/19 1803 98 %     Weight 04/03/19 1804 185 lb 8 oz (84.1 kg)     Height 04/03/19 1804 5\' 11"  (1.803 m)     Head Circumference --      Peak Flow --      Pain Score 04/03/19 1804 8     Pain Loc --      Pain Edu? --      Excl. in GC? --    No data found.  Updated Vital Signs BP 117/78 (BP Location: Right Arm)   Pulse (!) 112   Temp 98.1 F (36.7 C) (Oral)   Ht 5\' 11"  (1.803 m)   Wt 84.1 kg   SpO2 98%   BMI 25.87 kg/m   Visual Acuity Right Eye Distance:   Left Eye Distance:   Bilateral Distance:    Right Eye Near:   Left Eye Near:    Bilateral Near:     Physical Exam Nursing notes and Vital Signs reviewed. Appearance:  Patient appears stated age, and in no acute distress Eyes:  Pupils are equal, round, and reactive to light and accomodation.  Extraocular movement is intact.  Conjunctivae are not inflamed.  Fundi benign.  No photophobia.  Ears:  Canals normal.  Tympanic membranes normal.  Nose:  Congested turbinates.  Maxillary sinus tenderness is present.  Pharynx:  Normal Neck:  Supple.  No adenopathy. Lungs:  Clear to auscultation.  Breath sounds are equal.  Moving air well. Heart:  Regular rate and rhythm without murmurs, rubs, or gallops.  Abdomen:  Nontender without masses or hepatosplenomegaly.  Bowel sounds are present.  No CVA or flank tenderness.  Extremities:  No edema.  Skin:  No rash present.  Neurologic:  Cranial nerves 2 through 12 are normal.  Patellar, achilles, and elbow reflexes are normal.  Cerebellar function is intact (finger-to-nose and rapid alternating hand movement).  Gait and station are normal.   UC Treatments / Results  Labs (all labs ordered are listed, but only abnormal results are displayed) Labs Reviewed -  No data to display  EKG   Radiology DG Sinuses Complete  Result Date: 04/03/2019 CLINICAL DATA:  Frontal headache congestion EXAM: PARANASAL SINUSES - COMPLETE 3 + VIEW COMPARISON:  MRI 10/28/2018 FINDINGS: The paranasal sinus are aerated. There is no evidence of sinus opacification air-fluid levels or mucosal thickening. No significant bone abnormalities are seen. IMPRESSION: Negative. Electronically Signed   By: 04/05/2019 M.D.   On: 04/03/2019 18:51    Procedures Procedures (including critical care time)  Medications Ordered in UC Medications  dexamethasone (DECADRON) injection 10 mg (10 mg Intramuscular Given 04/03/19 1900)  ketorolac (TORADOL) injection 60 mg (60 mg Intramuscular Given 04/03/19 1901)    Initial Impression / Assessment and Plan / UC Course  I have reviewed the triage vital signs and the nursing notes.  Pertinent labs & imaging results that were available during my care of the patient were reviewed by me and considered in my medical decision making (see chart for details).    Administered Solumedrol 80mg  IM Followup with Family Doctor if not improved in one week.    Final Clinical Impressions(s) / UC Diagnoses   Final diagnoses:  Acute nonintractable headache, unspecified headache type     Discharge Instructions     Resume prednisone Saturday 04/04/19.  May continue Phenergan for nausea. Add Pseudoephedrine (30mg , one or two every 4 to 6 hours) for sinus congestion.   May use Afrin nasal spray (or generic oxymetazoline) each morning for about 5 days and then discontinue.  Also recommend using saline nasal spray several times daily and saline nasal irrigation (AYR is a common brand).  Use Flonase nasal spray each morning after using Afrin nasal spray and saline nasal irrigation.       ED Prescriptions    None        Kandra Nicolas, MD 04/06/19 1652

## 2019-04-03 NOTE — Telephone Encounter (Signed)
Patient advised to go to the urgent care for treatment. In [place] office note she advised in office treatment.

## 2019-04-03 NOTE — Telephone Encounter (Signed)
Patient also left a vm asking this same question. Max Nuno not in the office today. Please advise.

## 2019-04-07 MED ORDER — RIZATRIPTAN BENZOATE 10 MG PO TABS: 10.0000 mg | ORAL_TABLET | ORAL | 0 refills | Status: DC | PRN

## 2019-05-03 IMAGING — DX CHEST - 2 VIEW
2 series · 2 of 2 positions shown · non-contrast
Comparison: May 01, 2018

CLINICAL DATA: Cough

EXAM:
CHEST - 2 VIEW

[chest pa]
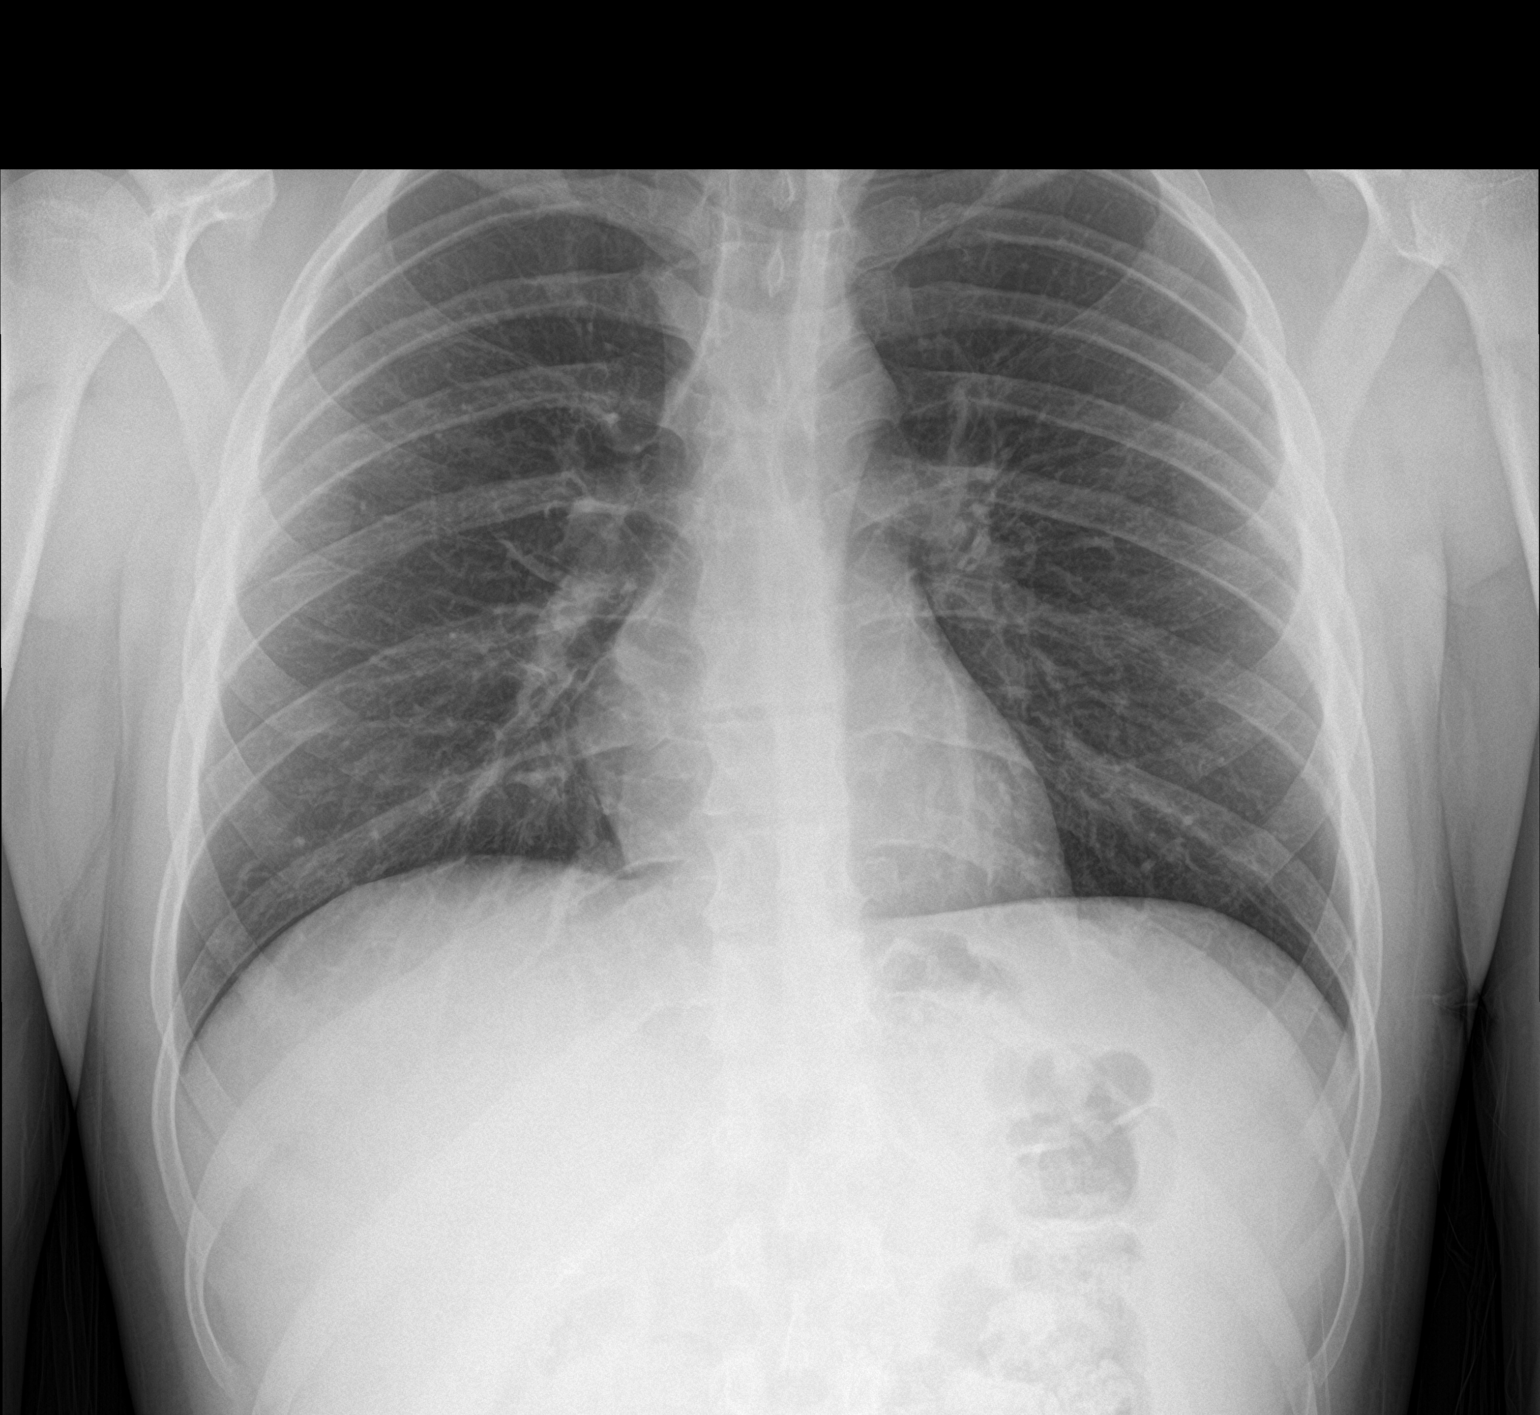

[chest lat]
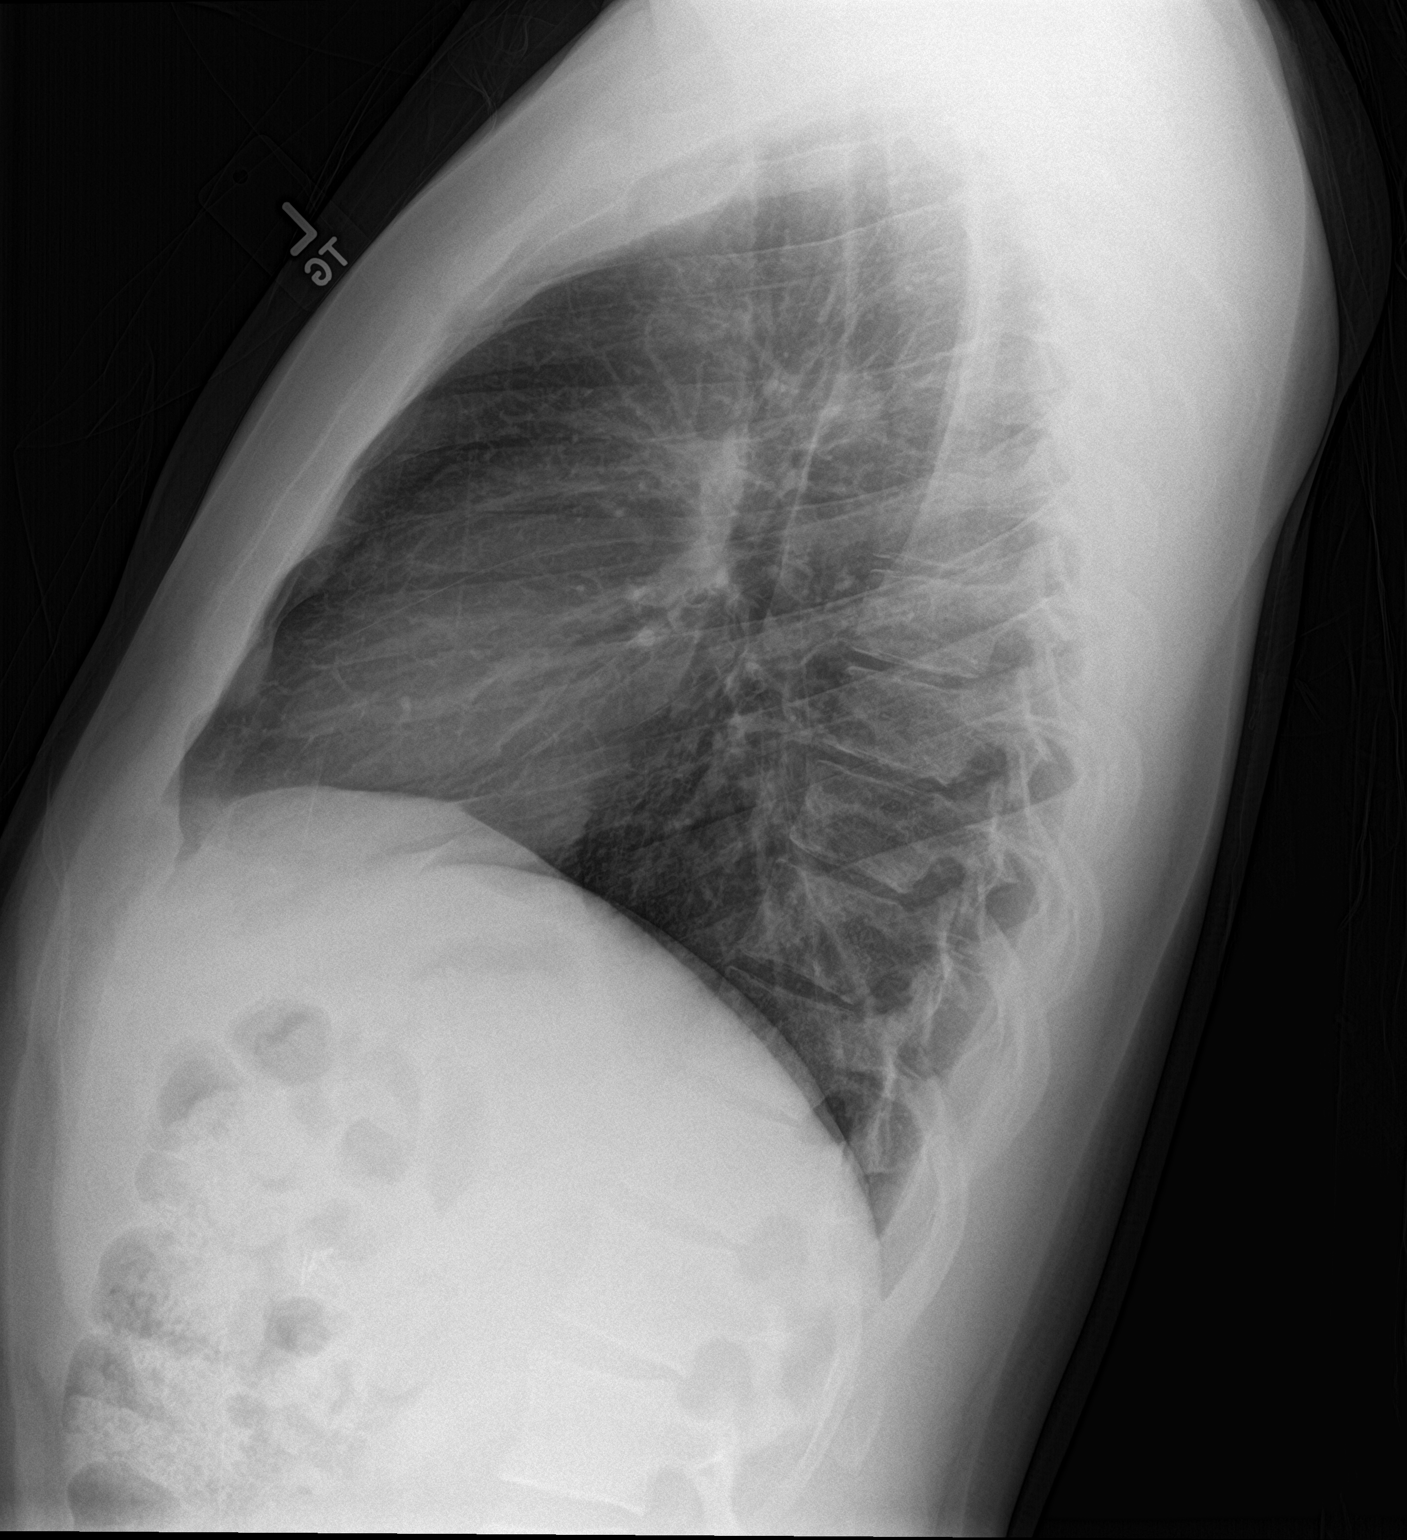

[2 of 2 positions shown; findings below may reference images not displayed]

FINDINGS: No edema or consolidation. The heart size and pulmonary vascularity
are normal. No adenopathy. No bone lesions.
IMPRESSION: No edema or consolidation.

## 2019-05-08 ENCOUNTER — Other Ambulatory Visit: Payer: Self-pay | Admitting: Physician Assistant

## 2019-05-13 ENCOUNTER — Encounter: Payer: Self-pay | Admitting: Physician Assistant

## 2019-05-14 ENCOUNTER — Ambulatory Visit: Payer: 59 | Admitting: Medical-Surgical

## 2019-05-25 ENCOUNTER — Other Ambulatory Visit: Payer: Self-pay

## 2019-05-25 DIAGNOSIS — Z79899 Other long term (current) drug therapy: Secondary | ICD-10-CM

## 2019-05-25 LAB — CBC WITH DIFFERENTIAL/PLATELET
Absolute Monocytes: 393 cells/uL (ref 200–950)
Basophils Absolute: 29 cells/uL (ref 0–200)
Basophils Relative: 0.5 %
Eosinophils Absolute: 0 cells/uL — ABNORMAL LOW (ref 15–500)
Eosinophils Relative: 0 %
HCT: 45.6 % (ref 38.5–50.0)
Hemoglobin: 15.7 g/dL (ref 13.2–17.1)
Lymphs Abs: 1533 cells/uL (ref 850–3900)
MCH: 30.5 pg (ref 27.0–33.0)
MCHC: 34.4 g/dL (ref 32.0–36.0)
MCV: 88.7 fL (ref 80.0–100.0)
MPV: 10.6 fL (ref 7.5–12.5)
Monocytes Relative: 6.9 %
Neutro Abs: 3745 cells/uL (ref 1500–7800)
Neutrophils Relative %: 65.7 %
Platelets: 169 10*3/uL (ref 140–400)
RBC: 5.14 10*6/uL (ref 4.20–5.80)
RDW: 13.1 % (ref 11.0–15.0)
Total Lymphocyte: 26.9 %
WBC: 5.7 10*3/uL (ref 3.8–10.8)

## 2019-05-25 NOTE — Progress Notes (Signed)
CBC stable please fax to [facility] at [facility] for his standing order.

## 2019-06-03 ENCOUNTER — Encounter: Payer: Self-pay | Admitting: Physician Assistant

## 2019-06-03 DIAGNOSIS — J329 Chronic sinusitis, unspecified: Secondary | ICD-10-CM

## 2019-06-03 DIAGNOSIS — J342 Deviated nasal septum: Secondary | ICD-10-CM

## 2019-06-03 DIAGNOSIS — R0981 Nasal congestion: Secondary | ICD-10-CM

## 2019-06-09 NOTE — Addendum Note (Signed)
Addended by: Jomarie Longs on: 06/09/2019 01:53 PM   Modules accepted: Orders

## 2019-06-12 ENCOUNTER — Ambulatory Visit: Payer: 59 | Attending: Internal Medicine

## 2019-06-12 DIAGNOSIS — Z23 Encounter for immunization: Secondary | ICD-10-CM

## 2019-06-12 NOTE — Progress Notes (Signed)
   Covid-19 Vaccination Clinic  Name:  Joshua Tyler    MRN: [id number] DOB: 08/06/1995  06/12/2019  [name] was observed post Covid-19 immunization for 15 minutes without incident. He was provided with Vaccine Information Sheet and instruction to access the V-Safe system.   [name] was instructed to call 911 with any severe reactions post vaccine: [name] Difficulty breathing  . Swelling of face and throat  . A fast heartbeat  . A bad rash all over body  . Dizziness and weakness   Immunizations Administered    Name Date Dose VIS Date Route   Pfizer COVID-19 Vaccine 06/12/2019  1:56 PM 0.3 mL 02/27/2019 Intramuscular   Manufacturer: [employer], [employer]   Lot: [id number]   NDC: [address]-4

## 2019-06-17 NOTE — Addendum Note (Signed)
Addended by: Jomarie Longs on: 06/17/2019 07:41 AM   Modules accepted: Orders

## 2019-07-08 ENCOUNTER — Ambulatory Visit: Payer: 59

## 2019-07-13 ENCOUNTER — Ambulatory Visit: Payer: 59 | Attending: Internal Medicine

## 2019-07-13 DIAGNOSIS — Z23 Encounter for immunization: Secondary | ICD-10-CM

## 2019-07-13 NOTE — Progress Notes (Signed)
   Covid-19 Vaccination Clinic  Name:  Keevin Panebianco    MRN: [id number] DOB: 09-08-1995  07/13/2019  Mr. Groene was observed post Covid-19 immunization for 15 minutes without incident. He was provided with Vaccine Information Sheet and instruction to access the V-Safe system.   Mr. Kliebert was instructed to call 911 with any severe reactions post vaccine: [name] Difficulty breathing  . Swelling of face and throat  . A fast heartbeat  . A bad rash all over body  . Dizziness and weakness   Immunizations Administered    Name Date Dose VIS Date Route   Pfizer COVID-19 Vaccine 07/13/2019 12:54 PM 0.3 mL 05/13/2018 Intramuscular   Manufacturer: [employer], [employer]   Lot: [id number]   NDC: [address]-5

## 2019-08-26 ENCOUNTER — Other Ambulatory Visit: Payer: Self-pay | Admitting: Neurology

## 2019-08-26 DIAGNOSIS — R7989 Other specified abnormal findings of blood chemistry: Secondary | ICD-10-CM

## 2019-08-27 LAB — CBC WITH DIFFERENTIAL/PLATELET
Absolute Monocytes: 435 cells/uL (ref 200–950)
Basophils Absolute: 30 cells/uL (ref 0–200)
Basophils Relative: 0.4 %
Eosinophils Absolute: 0 cells/uL — ABNORMAL LOW (ref 15–500)
Eosinophils Relative: 0 %
HCT: 47.9 % (ref 38.5–50.0)
Hemoglobin: 16.5 g/dL (ref 13.2–17.1)
Lymphs Abs: 1590 cells/uL (ref 850–3900)
MCH: 30.7 pg (ref 27.0–33.0)
MCHC: 34.4 g/dL (ref 32.0–36.0)
MCV: 89 fL (ref 80.0–100.0)
MPV: 10.5 fL (ref 7.5–12.5)
Monocytes Relative: 5.8 %
Neutro Abs: 5445 cells/uL (ref 1500–7800)
Neutrophils Relative %: 72.6 %
Platelets: 171 10*3/uL (ref 140–400)
RBC: 5.38 10*6/uL (ref 4.20–5.80)
RDW: 13 % (ref 11.0–15.0)
Total Lymphocyte: 21.2 %
WBC: 7.5 10*3/uL (ref 3.8–10.8)

## 2019-08-28 NOTE — Progress Notes (Signed)
STable please fax to [facility] at [facility].

## 2019-09-21 IMAGING — US LEFT LOWER EXTREMITY SOFT TISSUE ULTRASOUND LIMITED
1 series · 10 of 10 positions shown · non-contrast
Comparison: None.
COMPARISON: None.

Addendum:
CLINICAL DATA: 22-year-old male with soft tissue mass in the
anterior left lower extremity. Concern for lipoma. No prior surgery.

EXAM:
ULTRASOUND left LOWER EXTREMITY LIMITED
TECHNIQUE: Ultrasound examination of the lower extremity soft tissues was
performed in the area of clinical concern.

[Series 1: left lower extremity soft tissue ultrasound limite · 10 acquisitions, 10 frames shown]
[im 1/10]
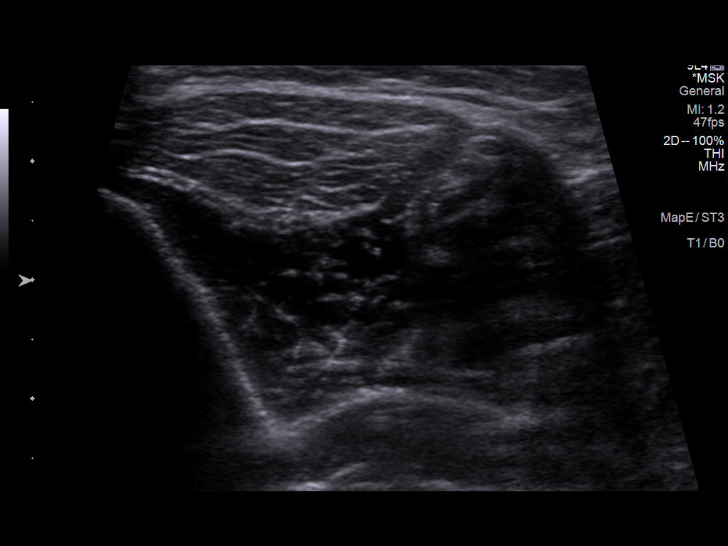
[im 2/10]
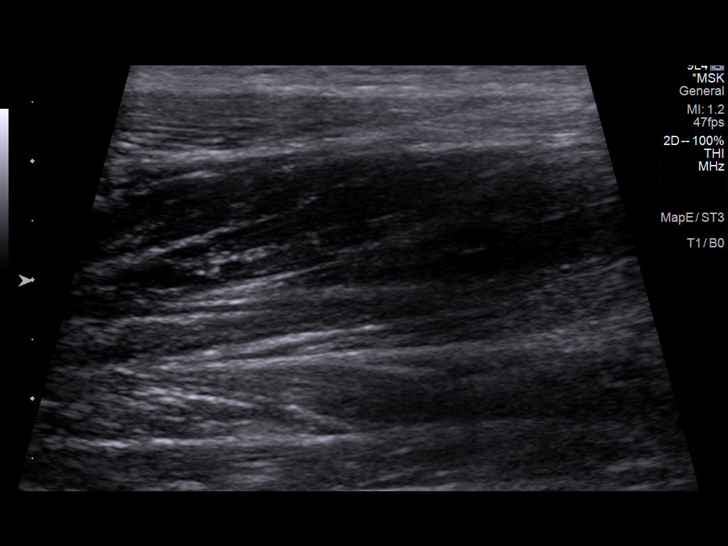
[im 3/10]
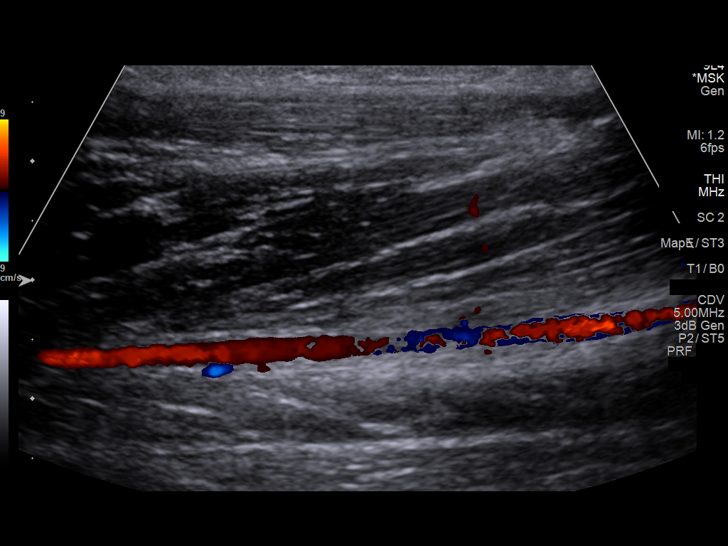
[im 4/10]
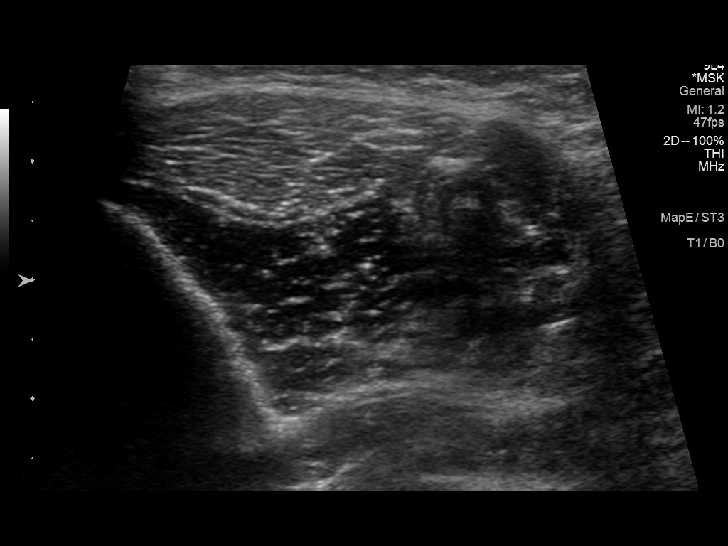
[im 5/10]
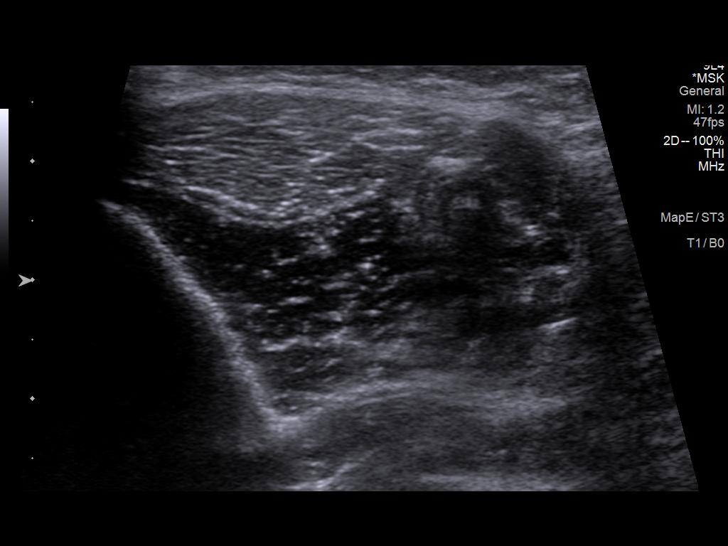
[im 6/10]
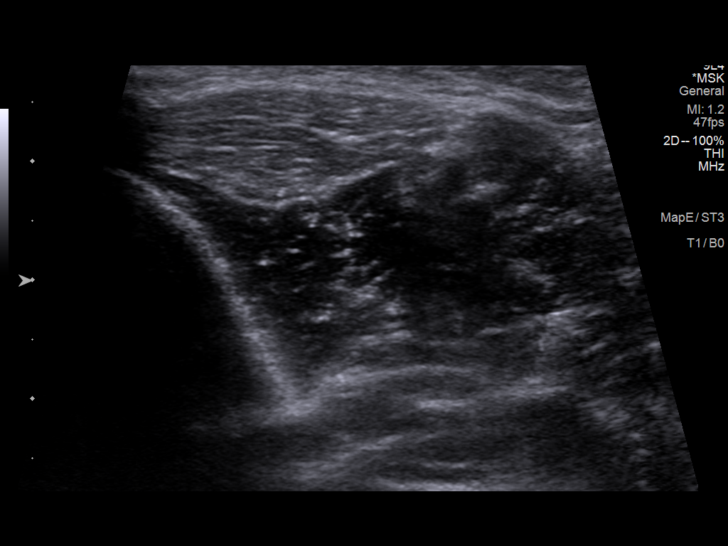
[im 7/10]
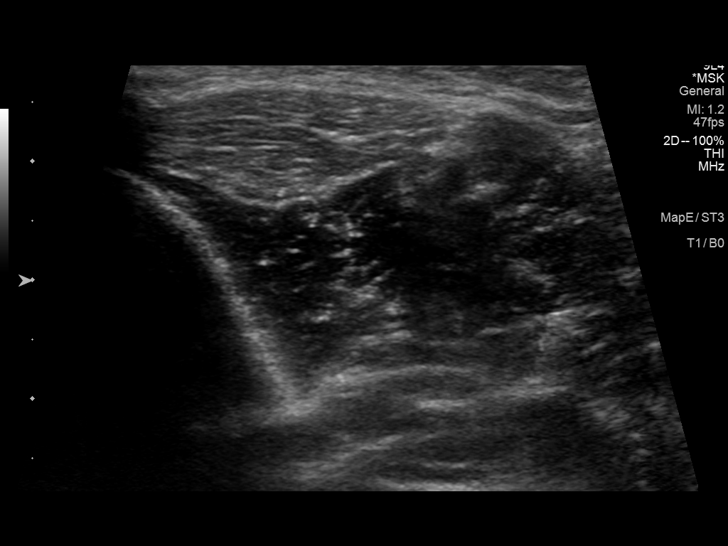
[im 8/10]
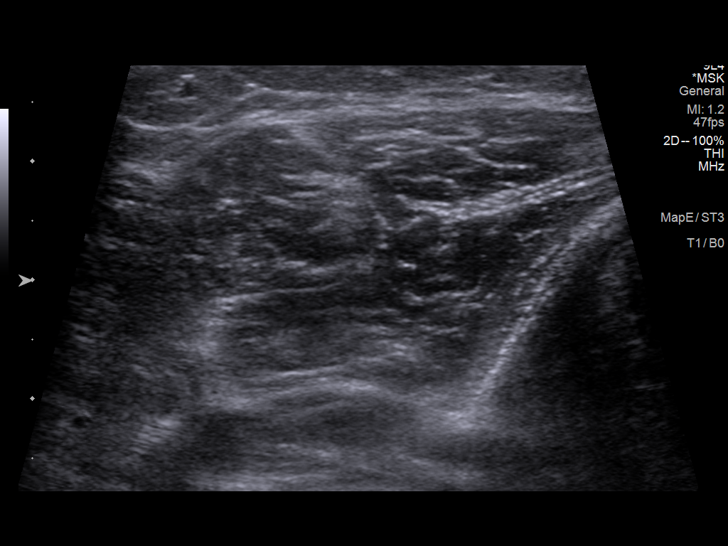
[im 9/10]
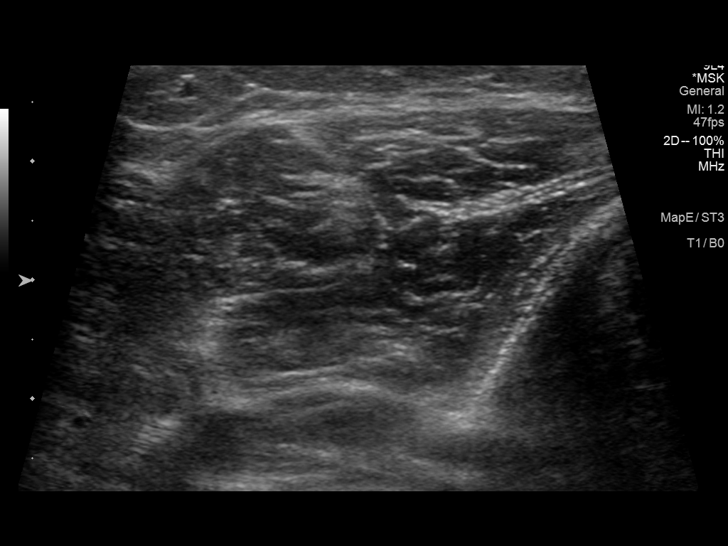
[im 10/10]
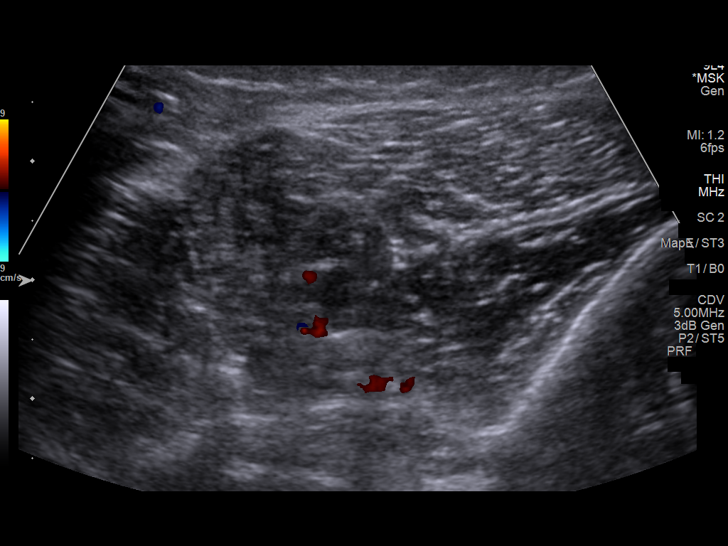

[10 of 10 positions shown; findings below may reference images not displayed]

FINDINGS: Targeted sonographic images of the anterolateral aspect of the left
thigh in the region of the clinical concern was performed.

No discrete mass, fluid collection, or architecture distortion
identified. No abnormal vascularity.
IMPRESSION: No mass. Clinical correlation and follow-up as clinically indicated.

ADDENDUM:
Please note the scan was taken over the left lower extremity in the
region of the clinical concern over the shin.

*** End of Addendum ***
FINDINGS: Targeted sonographic images of the anterolateral aspect of the left
thigh in the region of the clinical concern was performed.

No discrete mass, fluid collection, or architecture distortion
identified. No abnormal vascularity.
IMPRESSION: No mass. Clinical correlation and follow-up as clinically indicated.

## 2019-10-07 ENCOUNTER — Other Ambulatory Visit: Payer: Self-pay | Admitting: Physician Assistant

## 2019-10-12 DIAGNOSIS — J3489 Other specified disorders of nose and nasal sinuses: Secondary | ICD-10-CM

## 2019-10-12 HISTORY — DX: Other specified disorders of nose and nasal sinuses: J34.89

## 2020-01-27 ENCOUNTER — Encounter: Payer: Self-pay | Admitting: Physician Assistant

## 2020-01-27 ENCOUNTER — Other Ambulatory Visit: Payer: Self-pay

## 2020-01-27 ENCOUNTER — Ambulatory Visit (INDEPENDENT_AMBULATORY_CARE_PROVIDER_SITE_OTHER): Payer: 59 | Admitting: Physician Assistant

## 2020-01-27 VITALS — Ht 71.0 in | Wt 191.0 lb

## 2020-01-27 DIAGNOSIS — Z23 Encounter for immunization: Secondary | ICD-10-CM | POA: Diagnosis not present

## 2020-01-27 DIAGNOSIS — I471 Supraventricular tachycardia, unspecified: Secondary | ICD-10-CM

## 2020-01-27 DIAGNOSIS — I951 Orthostatic hypotension: Secondary | ICD-10-CM

## 2020-01-27 DIAGNOSIS — Z79899 Other long term (current) drug therapy: Secondary | ICD-10-CM

## 2020-01-27 DIAGNOSIS — R55 Syncope and collapse: Secondary | ICD-10-CM | POA: Diagnosis not present

## 2020-01-27 DIAGNOSIS — I498 Other specified cardiac arrhythmias: Secondary | ICD-10-CM

## 2020-01-27 DIAGNOSIS — G90A Postural orthostatic tachycardia syndrome (POTS): Secondary | ICD-10-CM

## 2020-01-27 NOTE — Progress Notes (Signed)
Subjective:    Patient ID: Joshua Tyler, male    DOB: 11-Aug-1995, 24 y.o.   MRN: [id number]  HPI  Patient is a 24 year old male with schizoaffective disorder and bipolar that is managed very well by [name] psychiatry.  His psychiatrist is concerned because he has been having these near syncopal episodes for a while and wanted him evaluated.  There is some concern some of his medications could be inducing this.  Patient reports he has been having these episodes for approximately 2 years.  It is not every time he stands or changes position but it is usually at least one time a day.  Feels like it seems to be getting worse.  He has had elevated heart rate for many months.  He is seeing cardiology and had multiple echoes and stress test but never started on medication.  Patient denies any chest pains or palpitations.  He describes the near syncopal episodes when he stands or changes positions as feeling like he is going to collapse.  Patient has never completely passed out.  .. Active Ambulatory Problems    Diagnosis Date Noted  . Bipolar 1 disorder (HCC) 07/21/2017  . Schizoaffective disorder (HCC) 07/21/2017  . Gastroesophageal reflux disease with esophagitis 07/21/2017  . Spleen enlarged 07/30/2017  . Status post cholecystectomy 09/30/2017  . Palpitations 05/01/2018  . Sinus tachycardia by electrocardiogram 10/27/2018  . PFO (patent foramen ovale) 10/28/2018  . Right axis deviation 10/28/2018  . Acute intractable headache 10/28/2018  . Abnormality of fibula 11/05/2018  . Bone thickening 11/07/2018  . Lower leg mass, left 11/07/2018  . Pain of left fibula 11/07/2018  . Left medial tibial stress syndrome 11/20/2018  . Deviated septum 06/03/2019  . Recurrent sinusitis 06/03/2019  . Near syncope 01/29/2020  . POTS (postural orthostatic tachycardia syndrome) 01/29/2020  . tachycardia 01/29/2020  . Orthostatic hypotension 01/29/2020   Resolved Ambulatory Problems    Diagnosis Date Noted  .  Dysuria 10/02/2017  . Left to right cardiac shunt 10/28/2018   No Additional Past Medical History          Review of Systems  All other systems reviewed and are negative.      Objective:   Physical Exam Vitals reviewed.  Constitutional:      Appearance: Normal appearance.  HENT:     Head: Normocephalic.  Neck:     Vascular: No carotid bruit.  Cardiovascular:     Rate and Rhythm: Regular rhythm. Tachycardia present.     Pulses: Normal pulses.  Pulmonary:     Effort: Pulmonary effort is normal.  Musculoskeletal:     Cervical back: No tenderness.  Lymphadenopathy:     Cervical: No cervical adenopathy.  Neurological:     General: No focal deficit present.     Mental Status: He is alert and oriented to person, place, and time.  Psychiatric:        Mood and Affect: Mood normal.           Assessment & Plan:  Marland KitchenMarland KitchenTrellis was seen today for follow-up.  Diagnoses and all orders for this visit:  Near syncope  Medication management -     CBC with Differential/Platelet -     Lipid Panel w/reflex Direct LDL  Flu vaccine need -     Flu Vaccine QUAD 36+ mos IM  Orthostatic hypotension -     COMPLETE METABOLIC PANEL WITH GFR -     TSH -     midodrine (PROAMATINE) 2.5 MG tablet; Take  1 tablet (2.5 mg total) by mouth 3 (three) times daily with meals.  tachycardia  POTS (postural orthostatic tachycardia syndrome) -     COMPLETE METABOLIC PANEL WITH GFR -     TSH -     midodrine (PROAMATINE) 2.5 MG tablet; Take 1 tablet (2.5 mg total) by mouth 3 (three) times daily with meals.    122/74 120 laying 127/72 123 sitting 103/74 125 standing  Patient certainly has heart rate changes and blood pressure changes that are clinically significant.  Will check TSH and CMP if there is no laboratory findings that would lead [name] to reason to have these changes will start midodrine three times a day.  Handouts given on both conditions.  Discussed safety changes so that patient  does not pass out or fall.  Follow-up in 1 month.  Extremely important to stay hydrated.

## 2020-01-27 NOTE — Patient Instructions (Addendum)
Orthostatic Hypotension Blood pressure is a measurement of how strongly, or weakly, your blood is pressing against the walls of your arteries. Orthostatic hypotension is a sudden drop in blood pressure that happens when you quickly change positions, such as when you get up from sitting or lying down. Arteries are blood vessels that carry blood from your heart throughout your body. When blood pressure is too low, you may not get enough blood to your brain or to the rest of your organs. This can cause weakness, light-headedness, rapid heartbeat, and fainting. This can last for just a few seconds or for up to a few minutes. Orthostatic hypotension is usually not a serious problem. However, if it happens frequently or gets worse, it may be a sign of something more serious. What are the causes? This condition may be caused by:  Sudden changes in posture, such as standing up quickly after you have been sitting or lying down.  Blood loss.  Loss of body fluids (dehydration).  Heart problems.  Hormone (endocrine) problems.  Pregnancy.  Severe infection.  Lack of certain nutrients.  Severe allergic reactions (anaphylaxis).  Certain medicines, such as blood pressure medicine or medicines that make the body lose excess fluids (diuretics). Sometimes, this condition can be caused by not taking medicine as directed, such as taking too much of a certain medicine. What increases the risk? The following factors may make you more likely to develop this condition:  Age. Risk increases as you get older.  Conditions that affect the heart or the central nervous system.  Taking certain medicines, such as blood pressure medicine or diuretics.  Being pregnant. What are the signs or symptoms? Symptoms of this condition may include:  Weakness.  Light-headedness.  Dizziness.  Blurred vision.  Fatigue.  Rapid heartbeat.  Fainting, in severe cases. How is this diagnosed? This condition is  diagnosed based on:  Your medical history.  Your symptoms.  Your blood pressure measurement. Your health care provider will check your blood pressure when you are: ? Lying down. ? Sitting. ? Standing. A blood pressure reading is recorded as two numbers, such as "120 over 80" (or 120/80). The first ("top") number is called the systolic pressure. It is a measure of the pressure in your arteries as your heart beats. The second ("bottom") number is called the diastolic pressure. It is a measure of the pressure in your arteries when your heart relaxes between beats. Blood pressure is measured in a unit called mm Hg. Healthy blood pressure for most adults is 120/80. If your blood pressure is below 90/60, you may be diagnosed with hypotension. Other information or tests that may be used to diagnose orthostatic hypotension include:  Your other vital signs, such as your heart rate and temperature.  Blood tests.  Tilt table test. For this test, you will be safely secured to a table that moves you from a lying position to an upright position. Your heart rhythm and blood pressure will be monitored during the test. How is this treated? This condition may be treated by:  Changing your diet. This may involve eating more salt (sodium) or drinking more water.  Taking medicines to raise your blood pressure.  Changing the dosage of certain medicines you are taking that might be lowering your blood pressure.  Wearing compression stockings. These stockings help to prevent blood clots and reduce swelling in your legs. In some cases, you may need to go to the hospital for:  Fluid replacement. This means you will   receive fluids through an IV.  Blood replacement. This means you will receive donated blood through an IV (transfusion).  Treating an infection or heart problems, if this applies.  Monitoring. You may need to be monitored while medicines that you are taking wear off. Follow these instructions  at home: Eating and drinking   Drink enough fluid to keep your urine pale yellow.  Eat a healthy diet, and follow instructions from your health care provider about eating or drinking restrictions. A healthy diet includes: ? Fresh fruits and vegetables. ? Whole grains. ? Lean meats. ? Low-fat dairy products.  Eat extra salt only as directed. Do not add extra salt to your diet unless your health care provider told you to do that.  Eat frequent, small meals.  Avoid standing up suddenly after eating. Medicines  Take over-the-counter and prescription medicines only as told by your health care provider. ? Follow instructions from your health care provider about changing the dosage of your current medicines, if this applies. ? Do not stop or adjust any of your medicines on your own. General instructions   Wear compression stockings as told by your health care provider.  Get up slowly from lying down or sitting positions. This gives your blood pressure a chance to adjust.  Avoid hot showers and excessive heat as directed by your health care provider.  Return to your normal activities as told by your health care provider. Ask your health care provider what activities are safe for you.  Do not use any products that contain nicotine or tobacco, such as cigarettes, e-cigarettes, and chewing tobacco. If you need help quitting, ask your health care provider.  Keep all follow-up visits as told by your health care provider. This is important. Contact a health care provider if you:  Vomit.  Have diarrhea.  Have a fever for more than 2-3 days.  Feel more thirsty than usual.  Feel weak and tired. Get help right away if you:  Have chest pain.  Have a fast or irregular heartbeat.  Develop numbness in any part of your body.  Cannot move your arms or your legs.  Have trouble speaking.  Become sweaty or feel light-headed.  Faint.  Feel short of breath.  Have trouble staying  awake.  Feel confused. Summary  Orthostatic hypotension is a sudden drop in blood pressure that happens when you quickly change positions.  Orthostatic hypotension is usually not a serious problem.  It is diagnosed by having your blood pressure taken lying down, sitting, and then standing.  It may be treated by changing your diet or adjusting your medicines. This information is not intended to replace advice given to you by your health care provider. Make sure you discuss any questions you have with your health care provider. Document Revised: 08/29/2017 Document Reviewed: 08/29/2017 Elsevier Patient Education  2020 [employer]   Postural Orthostatic Tachycardia Syndrome Postural orthostatic tachycardia syndrome (POTS) is a group of symptoms that occur when a person stands up after lying down. POTS occurs when less blood than normal flows to the body when you stand up. The reduced blood flow to the body makes the heart beat rapidly. POTS may be associated with another medical condition, or it may occur on its own. What are the causes? The cause of this condition is not known, but many conditions and diseases are associated with it. What increases the risk? This condition is more likely to develop in:  Women 60-23 years old.  Women who are  pregnant.  Women who are in their period (menstruating).  People who have certain conditions, such as: ? Infection from a virus. ? Attacks of healthy organs by the body's immunity (autoimmune disease). ? Losing a lot of red blood cells (anemia). ? Losing too much water in the body (dehydration). ? An overactive thyroid (hyperthyroidism).  People who take certain medicines.  People who have had a major injury.  People who have had surgery. What are the signs or symptoms? The most common symptom of this condition is light-headedness when one stands from a lying or sitting position. Other symptoms may include:  Feeling a rapid increase  in the heartbeat (tachycardia) within 10 minutes of standing up.  Fainting.  Weakness.  Confusion.  Trembling.  Shortness of breath.  Sweating or flushing.  Headache.  Chest pain.  Breathing that is deeper and faster than normal (hyperventilation).  Nausea.  Anxiety. Symptoms may be worse in the morning, and they may be relieved by lying down. How is this diagnosed? This condition is diagnosed based on:  Your symptoms.  Your medical history.  A physical exam.  Checking your heart rate when you are lying down and after you stand up.  Checking your blood pressure when you go from lying down to standing up.  Blood tests to measure hormones that change with blood pressure. The blood tests will be done when you are lying down and when you are standing up. You may have other tests to check for conditions or diseases that are associated with POTS. How is this treated? Treatment for this condition depends on how severe your symptoms are and whether you have any conditions or diseases that are associated with POTS. Treatment may involve:  Treating any conditions or diseases that are associated with POTS.  Drinking two glasses of water before getting up from a lying position.  Eating more salt (sodium).  Taking medicine to control blood pressure and heart rate (beta-blocker).  Avoiding certain medicines.  Starting an exercise program under the supervision of a health care provider. Follow these instructions at home: Medicines  Take over-the-counter and prescription medicines only as told by your health care provider.  Let your health care provider know about all prescription or over-the-counter medicines. These include herbs, vitamins, and supplements. You may need to stop or adjust some medicines if they cause this condition.  Talk with your health care provider before starting any new medicines. Eating and drinking   Drink enough fluid to keep your urine pale  yellow.  If told by your health care provider, drink two glasses of water before getting up from a lying position.  Follow instructions from your health care provider about how much sodium you should eat.  Avoid heavy meals. Eat several small meals a day instead of a few large meals. General instructions  Do an aerobic exercise for 20 minutes a day, at least 3 days a week.  Ask your health care provider what kinds of exercise are safe for you.  Do not use any products that contain nicotine or tobacco, such as cigarettes and e-cigarettes. These can interfere with blood flow. If you need help quitting, ask your health care provider.  Keep all follow-up visits as told by your health care provider. This is important. Contact a health care provider if:  Your symptoms do not improve after treatment.  Your symptoms get worse.  You develop new symptoms. Get help right away if:  You have chest pain.  You have difficulty  breathing.  You have fainting episodes. These symptoms may represent a serious problem that is an emergency. Do not wait to see if the symptoms will go away. Get medical help right away. Call your local emergency services (911 in the [place] Do not drive yourself to the hospital. Summary  POTS is a condition that can cause light-headedness, fainting, and palpitations when you go from a sitting or lying position to a standing position. It occurs when less blood than normal flows to the body when you stand up.  Treatment for this condition includes treating any underlying conditions, drinking plenty of water, stopping or changing some medicines, or starting an exercise program.  Get help right away if you have chest pain, difficulty breathing, or fainting episodes. These may represent a serious problem that is an emergency. This information is not intended to replace advice given to you by your health care provider. Make sure you discuss any questions you have with your  health care provider. Document Revised: 04/16/2017 Document Reviewed: 04/16/2017 Elsevier Patient Education  2020 [employer].

## 2020-01-28 LAB — COMPLETE METABOLIC PANEL WITH GFR
AG Ratio: 2.4 (calc) (ref 1.0–2.5)
ALT: 25 U/L (ref 9–46)
AST: 18 U/L (ref 10–40)
Albumin: 4.7 g/dL (ref 3.6–5.1)
Alkaline phosphatase (APISO): 94 U/L (ref 36–130)
BUN: 13 mg/dL (ref 7–25)
CO2: 26 mmol/L (ref 20–32)
Calcium: 9.8 mg/dL (ref 8.6–10.3)
Chloride: 106 mmol/L (ref 98–110)
Creat: 0.9 mg/dL (ref 0.60–1.35)
GFR, Est African American: 137 mL/min/{1.73_m2} (ref >=60)
GFR, Est Non African American: 118 mL/min/{1.73_m2} (ref >=60)
Globulin: 2 g/dL (calc) (ref 1.9–3.7)
Glucose, Bld: 92 mg/dL (ref 65–99)
Potassium: 4.1 mmol/L (ref 3.5–5.3)
Sodium: 142 mmol/L (ref 135–146)
Total Bilirubin: 1.1 mg/dL (ref 0.2–1.2)
Total Protein: 6.7 g/dL (ref 6.1–8.1)

## 2020-01-28 LAB — CBC WITH DIFFERENTIAL/PLATELET
Absolute Monocytes: 551 cells/uL (ref 200–950)
Basophils Absolute: 32 cells/uL (ref 0–200)
Basophils Relative: 0.3 %
Eosinophils Absolute: 0 cells/uL — ABNORMAL LOW (ref 15–500)
Eosinophils Relative: 0 %
HCT: 47.4 % (ref 38.5–50.0)
Hemoglobin: 16.8 g/dL (ref 13.2–17.1)
Lymphs Abs: 1544 cells/uL (ref 850–3900)
MCH: 30.4 pg (ref 27.0–33.0)
MCHC: 35.4 g/dL (ref 32.0–36.0)
MCV: 85.7 fL (ref 80.0–100.0)
MPV: 10.9 fL (ref 7.5–12.5)
Monocytes Relative: 5.1 %
Neutro Abs: 8672 cells/uL — ABNORMAL HIGH (ref 1500–7800)
Neutrophils Relative %: 80.3 %
Platelets: 173 10*3/uL (ref 140–400)
RBC: 5.53 10*6/uL (ref 4.20–5.80)
RDW: 12.9 % (ref 11.0–15.0)
Total Lymphocyte: 14.3 %
WBC: 10.8 10*3/uL (ref 3.8–10.8)

## 2020-01-28 LAB — TSH: TSH: 1.03 mIU/L (ref 0.40–4.50)

## 2020-01-28 LAB — LIPID PANEL W/REFLEX DIRECT LDL
Cholesterol: 158 mg/dL (ref <200)
HDL: 41 mg/dL (ref >=40)
LDL Cholesterol (Calc): 96 mg/dL (calc)
Non-HDL Cholesterol (Calc): 117 mg/dL (calc) (ref <130)
Total CHOL/HDL Ratio: 3.9 (calc) (ref <5.0)
Triglycerides: 112 mg/dL (ref <150)

## 2020-01-29 DIAGNOSIS — G90A Postural orthostatic tachycardia syndrome (POTS): Secondary | ICD-10-CM

## 2020-01-29 DIAGNOSIS — I471 Supraventricular tachycardia, unspecified: Secondary | ICD-10-CM

## 2020-01-29 DIAGNOSIS — R55 Syncope and collapse: Secondary | ICD-10-CM

## 2020-01-29 DIAGNOSIS — I951 Orthostatic hypotension: Secondary | ICD-10-CM

## 2020-01-29 DIAGNOSIS — I498 Other specified cardiac arrhythmias: Secondary | ICD-10-CM

## 2020-01-29 HISTORY — DX: Supraventricular tachycardia, unspecified: I47.10

## 2020-01-29 HISTORY — DX: Orthostatic hypotension: I95.1

## 2020-01-29 HISTORY — DX: Postural orthostatic tachycardia syndrome (POTS): G90.A

## 2020-01-29 HISTORY — DX: Syncope and collapse: R55

## 2020-01-29 HISTORY — DX: Other specified cardiac arrhythmias: I49.8

## 2020-01-29 HISTORY — DX: Supraventricular tachycardia: I47.1

## 2020-01-29 MED ORDER — MIDODRINE HCL 2.5 MG PO TABS: 2.5000 mg | ORAL_TABLET | Freq: Three times a day (TID) | ORAL | 0 refills | Status: DC

## 2020-01-29 NOTE — Progress Notes (Signed)
Joshua Tyler,   No concerns with labs. Sent midodrine to take three times a day. Follow up in 1 month.

## 2020-02-23 ENCOUNTER — Ambulatory Visit (INDEPENDENT_AMBULATORY_CARE_PROVIDER_SITE_OTHER): Payer: 59 | Admitting: Physician Assistant

## 2020-02-23 ENCOUNTER — Other Ambulatory Visit: Payer: Self-pay

## 2020-02-23 ENCOUNTER — Encounter: Payer: Self-pay | Admitting: Physician Assistant

## 2020-02-23 VITALS — Ht 71.0 in | Wt 193.0 lb

## 2020-02-23 DIAGNOSIS — R21 Rash and other nonspecific skin eruption: Secondary | ICD-10-CM

## 2020-02-23 DIAGNOSIS — I951 Orthostatic hypotension: Secondary | ICD-10-CM | POA: Diagnosis not present

## 2020-02-23 DIAGNOSIS — F319 Bipolar disorder, unspecified: Secondary | ICD-10-CM

## 2020-02-23 DIAGNOSIS — I471 Supraventricular tachycardia: Secondary | ICD-10-CM | POA: Diagnosis not present

## 2020-02-23 DIAGNOSIS — I498 Other specified cardiac arrhythmias: Secondary | ICD-10-CM | POA: Diagnosis not present

## 2020-02-23 DIAGNOSIS — R400 Somnolence: Secondary | ICD-10-CM

## 2020-02-23 DIAGNOSIS — F250 Schizoaffective disorder, bipolar type: Secondary | ICD-10-CM

## 2020-02-23 DIAGNOSIS — G90A Postural orthostatic tachycardia syndrome (POTS): Secondary | ICD-10-CM

## 2020-02-23 DIAGNOSIS — R410 Disorientation, unspecified: Secondary | ICD-10-CM

## 2020-02-23 MED ORDER — VALACYCLOVIR HCL 1 G PO TABS: 1000.0000 mg | ORAL_TABLET | Freq: Three times a day (TID) | ORAL | 0 refills | Status: DC

## 2020-02-23 MED ORDER — PRAMOXINE-CALAMINE 1-3 % EX LOTN: TOPICAL_LOTION | CUTANEOUS | 1 refills | Status: DC

## 2020-02-23 MED ORDER — MIDODRINE HCL 2.5 MG PO TABS: 2.5000 mg | ORAL_TABLET | Freq: Three times a day (TID) | ORAL | 1 refills | Status: DC

## 2020-02-23 NOTE — Progress Notes (Signed)
Subjective:    Patient ID: Joshua Tyler, male    DOB: September 02, 1995, 24 y.o.   MRN: [id number]  HPI  Patient is a 24 year old male with schizoaffective disorder and bipolar 1 who presents to the clinic to follow-up on orthostatic hypotension.  Patient is on certain psychiatric medications that we suspect could be causing some dizziness.  At last visit his orthostatic blood pressures were significant for orthostatic hypotension.  He was started on Joshua Tyler 3 times a day.  When he first started it a few days later he developed a painful burning rash on his lateral upper right back radiating into his axilla.  He thought it could be the Joshua Tyler so he stopped.  Rash did not resolved with stopping Joshua Tyler.  He continues to put Joshua Tyler on rash.  No vesicles were ever seen.  Never had a rash like this before.  He did have some worsening of his mood and was started on Joshua Tyler.  Does feel like his mood has improved.  He is not finished with his Joshua Tyler titration up to therapeutic dose.  He did decide to restart his Joshua Tyler about a week and a half ago as well.  He does feel like that has helped with his positional dizziness.  Unfortunately he has had some new neurological symptoms.  He has had 3 episodes of sudden confusion, spaciness.  He almost just checks out for about 15 to 20 minutes.  His wife says he looks pale and stops talking.  She denies that he has any speech changes, extremity weakness, facial asymmetry.  After this resolves he feels like his normal self.  He also had one episode of really bad pain across left thigh and down lateral left leg in the middle of the night for about 30 minutes. Denies any low back pain, numbness, tingling, low ext weakness.    .. Active Ambulatory Problems    Diagnosis Date Noted  . Bipolar 1 disorder (HCC) 07/21/2017  . Schizoaffective disorder (HCC) 07/21/2017  . Gastroesophageal reflux disease with esophagitis 07/21/2017  . Spleen enlarged 07/30/2017  .  Status post cholecystectomy 09/30/2017  . Palpitations 05/01/2018  . Sinus tachycardia by electrocardiogram 10/27/2018  . PFO (patent foramen ovale) 10/28/2018  . Right axis deviation 10/28/2018  . Acute intractable headache 10/28/2018  . Abnormality of fibula 11/05/2018  . Bone thickening 11/07/2018  . Lower leg mass, left 11/07/2018  . Pain of left fibula 11/07/2018  . Left medial tibial stress syndrome 11/20/2018  . Deviated septum 06/03/2019  . Recurrent sinusitis 06/03/2019  . Near syncope 01/29/2020  . POTS (postural orthostatic tachycardia syndrome) 01/29/2020  . tachycardia 01/29/2020  . Orthostatic hypotension 01/29/2020   Resolved Ambulatory Problems    Diagnosis Date Noted  . Dysuria 10/02/2017  . 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      Objective:   Physical Exam Vitals reviewed.  Constitutional:      Appearance: Normal appearance.  Cardiovascular:     Rate and Rhythm: Regular rhythm. Tachycardia present.     Pulses: Normal pulses.     Heart sounds: Normal heart sounds.  Pulmonary:     Effort: Pulmonary effort is normal.     Breath sounds: Normal breath sounds.  Neurological:     General: No focal deficit present.     Mental Status: He is alert and oriented to person,  place, and time.  Psychiatric:        Mood and Affect: Mood normal.           Assessment & Plan:  Joshua Tyler KitchenMarland KitchenLee was seen today for dizziness, rash and leg pain.  Diagnoses and all orders for this visit:  Orthostatic hypotension -     Joshua Tyler (Joshua Tyler) 2.5 MG tablet; Take 1 tablet (2.5 mg total) by mouth 3 (three) times daily with meals.  Rash -     Joshua Tyler (Joshua Tyler) 1000 MG tablet; Take 1 tablet (1,000 mg total) by mouth 3 (three) times daily. -     Joshua Tyler 1-3 % LOTN; Apply topically over rash up to 4 times a day.  POTS (postural orthostatic  tachycardia syndrome) -     Joshua Tyler (Joshua Tyler) 2.5 MG tablet; Take 1 tablet (2.5 mg total) by mouth 3 (three) times daily with meals.  tachycardia  Bipolar 1 disorder (HCC)  Schizoaffective disorder, bipolar type (HCC)   Orthostatics have improved since last visit and patients reports less of that type of dizziness. Stay on Joshua Tyler.   I think rash was shingles. Start Joshua Tyler and OTC cream.   HR still elevated. Suggested beta blocker to help control heart rate. Sent mychart note. Awaiting reply.   Seems like Joshua Tyler is helping mood. No apparent interactions. He is having new neuro symptoms. Unclear cause. Does not sound like cardiac, vertigo or orthostatic hypotension since lasting 20 minutes. Concerned for seizure activity. Will refer to neurology for evaluation.

## 2020-02-23 NOTE — Patient Instructions (Signed)
Shingles  Shingles is an infection. It gives you a painful skin rash and blisters that have fluid in them. Shingles is caused by the same germ (virus) that causes chickenpox. Shingles only happens in people who:  Have had chickenpox.  Have been given a shot of medicine (vaccine) to protect against chickenpox. Shingles is rare in this group. The first symptoms of shingles may be itching, tingling, or pain in an area on your skin. A rash will show on your skin a few days or weeks later. The rash is likely to be on one side of your body. The rash usually has a shape like a belt or a band. Over time, the rash turns into fluid-filled blisters. The blisters will break open, change into scabs, and dry up. Medicines may:  Help with pain and itching.  Help you get better sooner.  Help to prevent long-term problems. Follow these instructions at home: Medicines  Take over-the-counter and prescription medicines only as told by your doctor.  Put on an anti-itch cream or numbing cream where you have a rash, blisters, or scabs. Do this as told by your doctor. Helping with itching and discomfort   Put cold, wet cloths (cold compresses) on the area of the rash or blisters as told by your doctor.  Cool baths can help you feel better. Try adding baking soda or dry oatmeal to the water to lessen itching. Do not bathe in hot water. Blister and rash care  Keep your rash covered with a loose bandage (dressing).  Wear loose clothing that does not rub on your rash.  Keep your rash and blisters clean. To do this, wash the area with mild soap and cool water as told by your doctor.  Check your rash every day for signs of infection. Check for: ? More redness, swelling, or pain. ? Fluid or blood. ? Warmth. ? Pus or a bad smell.  Do not scratch your rash. Do not pick at your blisters. To help you to not scratch: ? Keep your fingernails clean and cut short. ? Wear gloves or mittens when you sleep, if  scratching is a problem. General instructions  Rest as told by your doctor.  Keep all follow-up visits as told by your doctor. This is important.  Wash your hands often with soap and water. If soap and water are not available, use hand sanitizer. Doing this lowers your chance of getting a skin infection caused by germs (bacteria).  Your infection can cause chickenpox in people who have never had chickenpox or never got a shot of chickenpox vaccine. If you have blisters that did not change into scabs yet, try not to touch other people or be around other people, especially: ? Babies. ? Pregnant women. ? Children who have areas of red, itchy, or rough skin (eczema). ? Very old people who have transplants. ? People who have a long-term (chronic) sickness, like cancer or AIDS. Contact a doctor if:  Your pain does not get better with medicine.  Your pain does not get better after the rash heals.  You have any signs of infection in the rash area. These signs include: ? More redness, swelling, or pain around the rash. ? Fluid or blood coming from the rash. ? The rash area feeling warm to the touch. ? Pus or a bad smell coming from the rash. Get help right away if:  The rash is on your face or nose.  You have pain in your face or pain by   your eye.  You lose feeling on one side of your face.  You have trouble seeing.  You have ear pain, or you have ringing in your ear.  You have a loss of taste.  Your condition gets worse. Summary  Shingles gives you a painful skin rash and blisters that have fluid in them.  Shingles is an infection. It is caused by the same germ (virus) that causes chickenpox.  Keep your rash covered with a loose bandage (dressing). Wear loose clothing that does not rub on your rash.  If you have blisters that did not change into scabs yet, try not to touch other people or be around people. This information is not intended to replace advice given to you by  your health care provider. Make sure you discuss any questions you have with your health care provider. Document Revised: 06/27/2018 Document Reviewed: 11/07/2016 Elsevier Patient Education  2020 Elsevier Inc.  

## 2020-02-24 ENCOUNTER — Encounter: Payer: Self-pay | Admitting: Physician Assistant

## 2020-02-24 ENCOUNTER — Telehealth: Payer: Self-pay | Admitting: Physician Assistant

## 2020-02-24 DIAGNOSIS — R21 Rash and other nonspecific skin eruption: Secondary | ICD-10-CM

## 2020-02-24 IMAGING — DX DG SINUSES COMPLETE 3+V
3 series · 3 of 3 positions shown · non-contrast
Comparison: MRI 10/28/2018

CLINICAL DATA: Frontal headache congestion

EXAM:
PARANASAL SINUSES - COMPLETE 3 + VIEW

[pns waters]
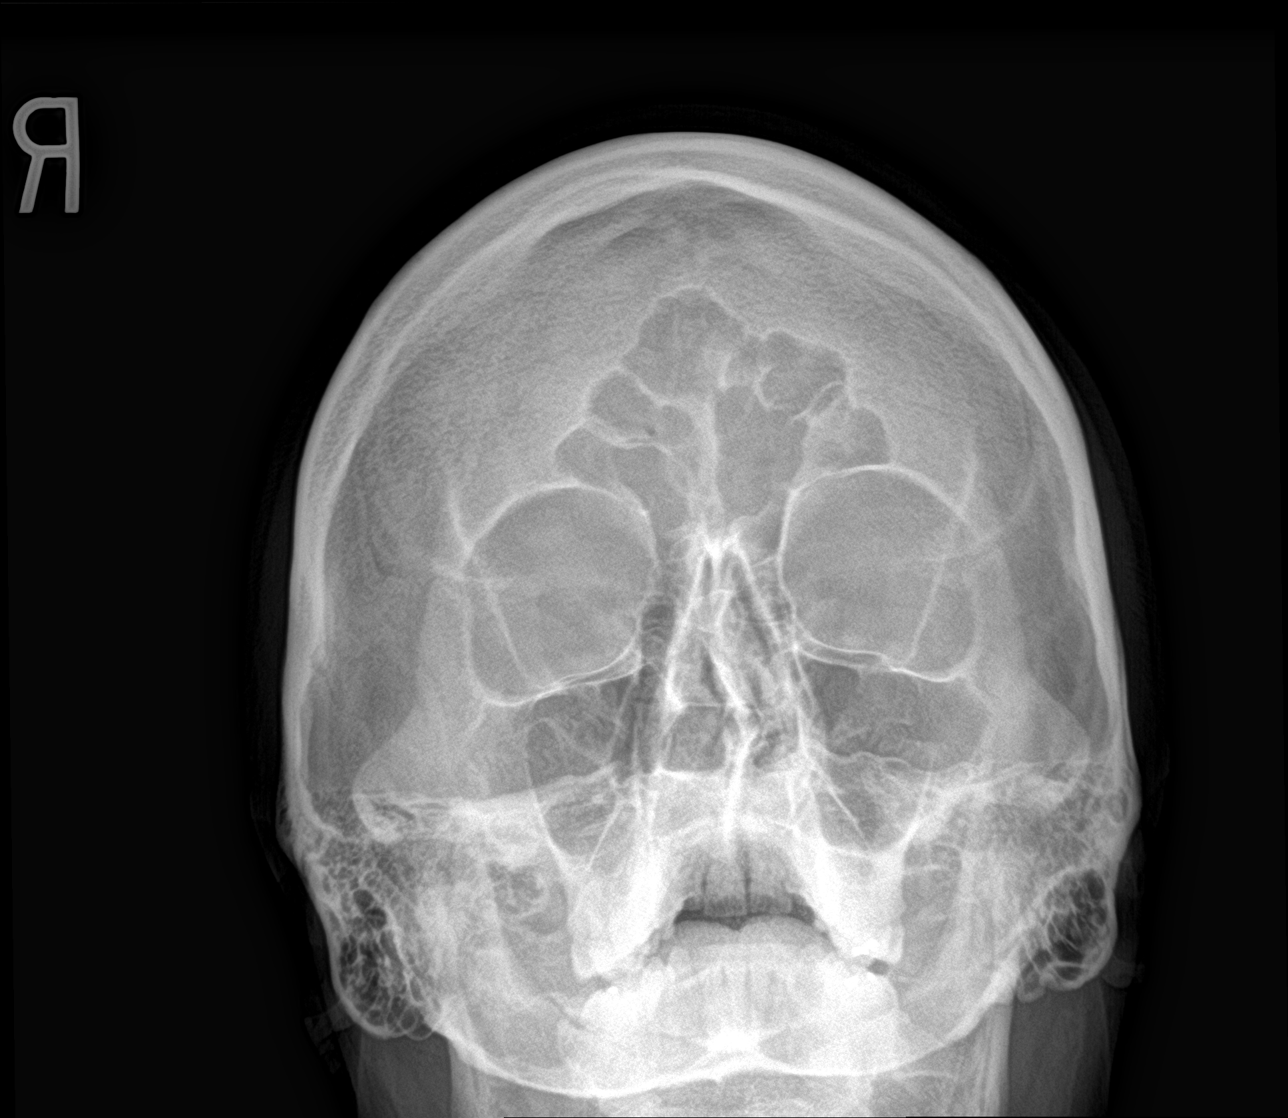

[[person_name]]
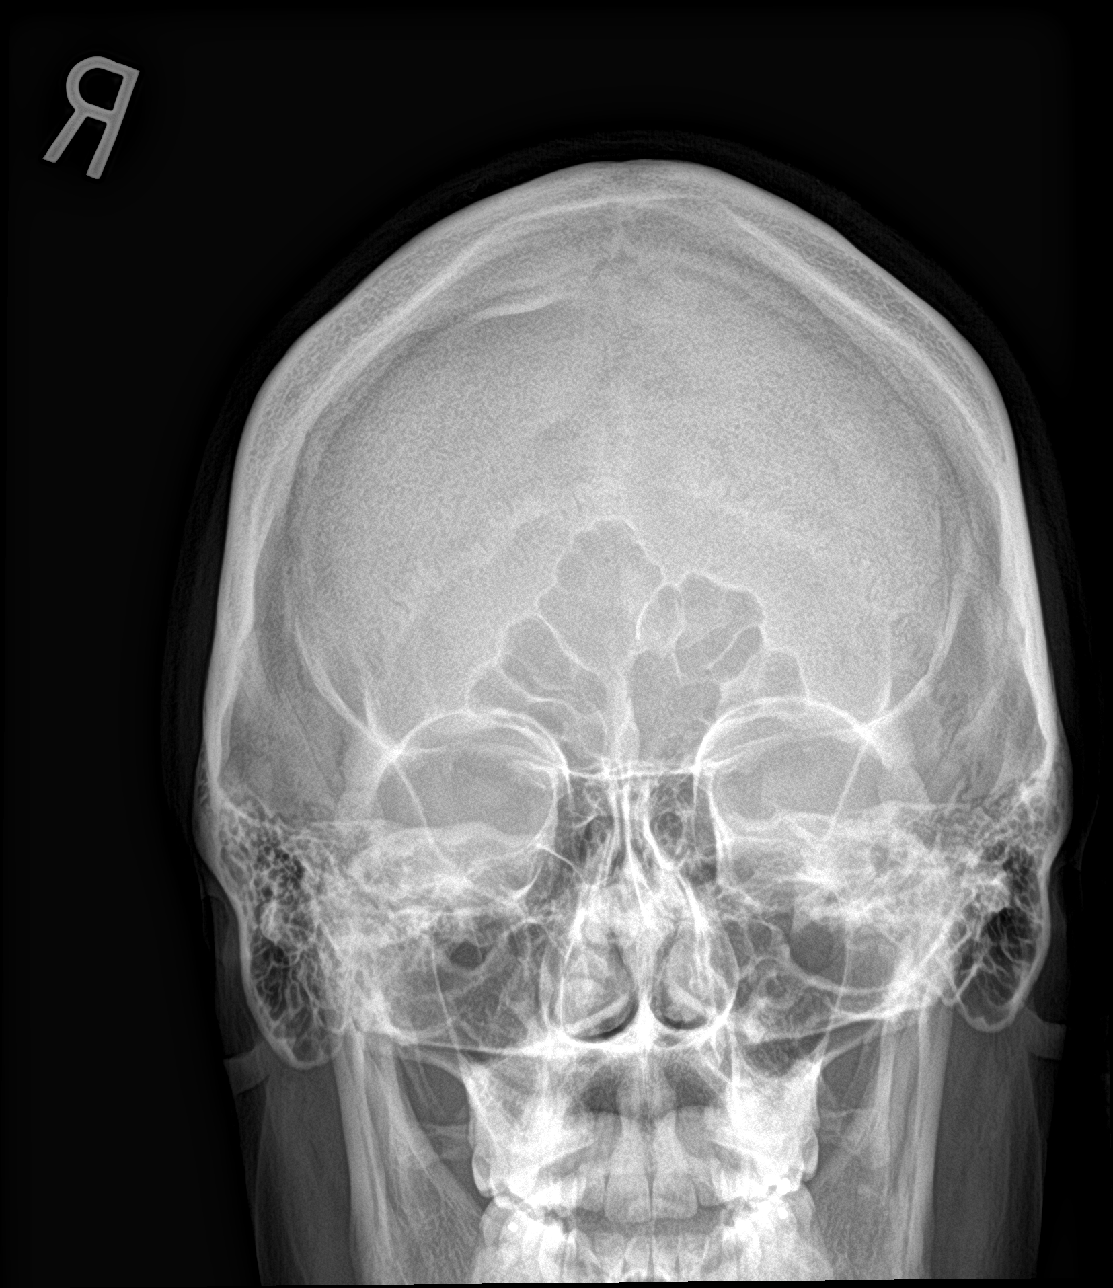

[pns lat]
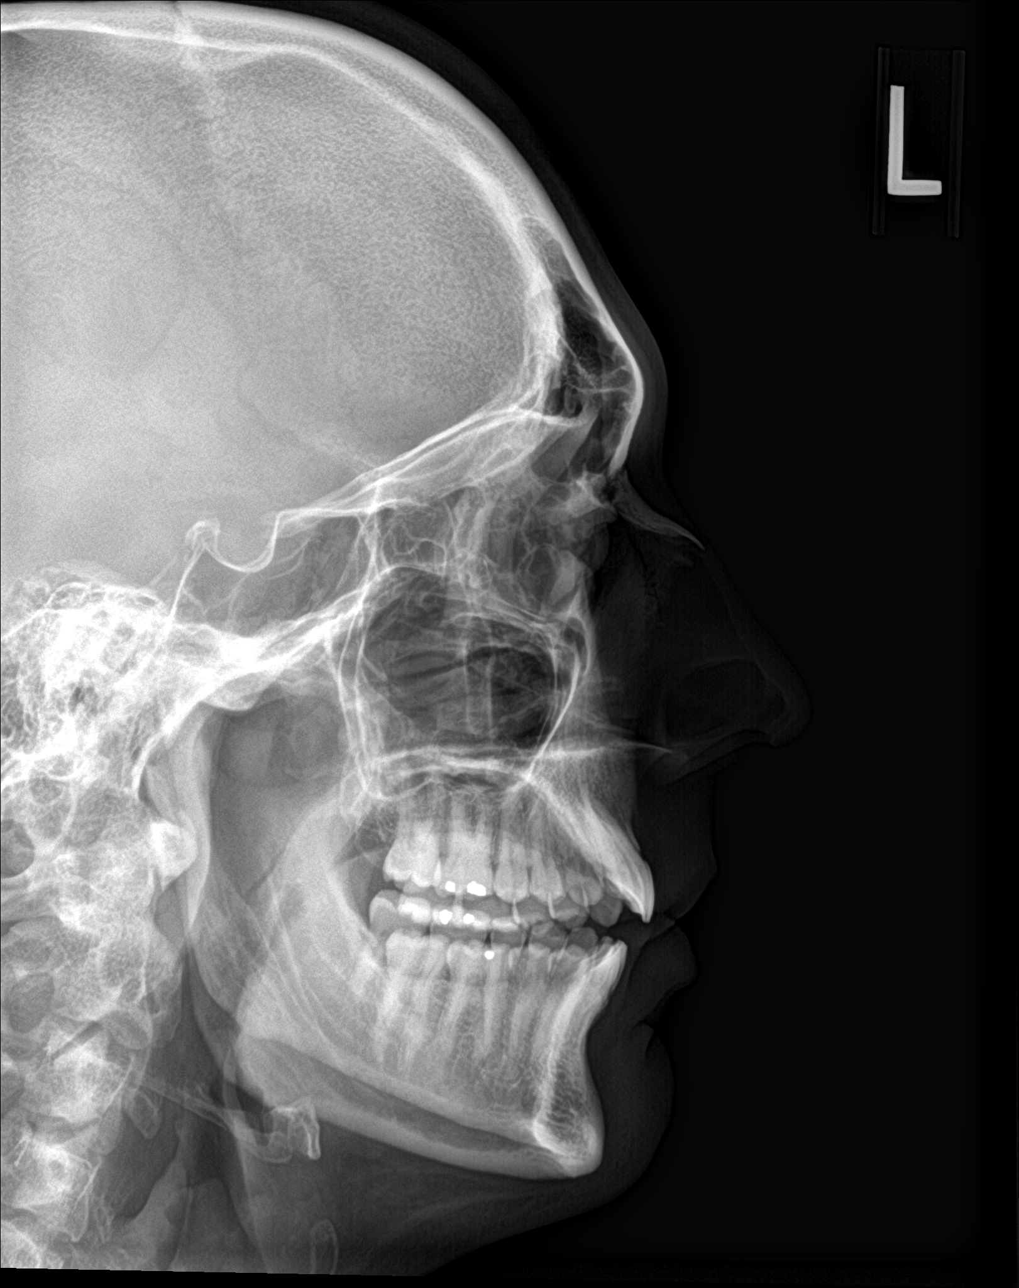

[3 of 3 positions shown; findings below may reference images not displayed]

FINDINGS: The paranasal sinus are aerated. There is no evidence of sinus
opacification air-fluid levels or mucosal thickening. No significant
bone abnormalities are seen.
IMPRESSION: Negative.

## 2020-02-24 MED ORDER — PRAMOXINE-CALAMINE 1-3 % EX LOTN: TOPICAL_LOTION | CUTANEOUS | 1 refills | Status: DC

## 2020-02-24 NOTE — Telephone Encounter (Signed)
Sent mychart

## 2020-02-24 NOTE — Addendum Note (Signed)
Addended by: Jomarie Longs on: 02/24/2020 03:47 PM   Modules accepted: Orders

## 2020-02-26 MED ORDER — METOPROLOL SUCCINATE ER 25 MG PO TB24: 25.0000 mg | ORAL_TABLET | Freq: Every day | ORAL | 2 refills | Status: DC

## 2020-02-26 NOTE — Telephone Encounter (Signed)
Pt notified of rx. 

## 2020-02-26 NOTE — Telephone Encounter (Signed)
I sent metoprolol low dose.

## 2020-02-26 NOTE — Addendum Note (Signed)
Addended by: Jomarie Longs on: 02/26/2020 09:16 AM   Modules accepted: Orders

## 2020-03-01 ENCOUNTER — Telehealth: Payer: Self-pay | Admitting: Physician Assistant

## 2020-03-01 DIAGNOSIS — Z79899 Other long term (current) drug therapy: Secondary | ICD-10-CM

## 2020-03-01 NOTE — Telephone Encounter (Signed)
Labs ordered. Patient is on 500 mg BID. He is aware orders placed.

## 2020-03-01 NOTE — Telephone Encounter (Signed)
Patient stopped by , stating that he started Depakote about a week and a half ago and was wanting Labs checked for Depakote, spoke with Marylene Land in Triage and she said we need to send PCP a message. AM

## 2020-03-01 NOTE — Telephone Encounter (Signed)
Let patient know we need levels/labs when he gets to that dose.

## 2020-03-01 NOTE — Telephone Encounter (Signed)
From psychiatry note: need for liver enzyme and Depakote level monitoring. Will plan to start Depakote DR 250 mg at bedtime tonight. Will increase gradually up to 500 mg BID based on my instructions in MyChart message. Once at 500 mg BID, they will get Depakote level drawn. They will stay in touch regarding his symptoms and tolerability of the medication.    Electronically signed by Haynes Bast, MD at 02/17/2020 2:14 PM EST     Please advise

## 2020-03-07 ENCOUNTER — Other Ambulatory Visit: Payer: Self-pay

## 2020-03-09 ENCOUNTER — Ambulatory Visit: Payer: 59 | Admitting: Cardiology

## 2020-03-15 LAB — HEPATIC FUNCTION PANEL
AG Ratio: 2.3 (calc) (ref 1.0–2.5)
ALT: 19 U/L (ref 9–46)
AST: 13 U/L (ref 10–40)
Albumin: 4.3 g/dL (ref 3.6–5.1)
Alkaline phosphatase (APISO): 70 U/L (ref 36–130)
Bilirubin, Direct: 0.1 mg/dL (ref 0.0–0.2)
Globulin: 1.9 g/dL (calc) (ref 1.9–3.7)
Indirect Bilirubin: 0.5 mg/dL (calc) (ref 0.2–1.2)
Total Bilirubin: 0.6 mg/dL (ref 0.2–1.2)
Total Protein: 6.2 g/dL (ref 6.1–8.1)

## 2020-03-15 LAB — VALPROIC ACID LEVEL: Valproic Acid Lvl: 55.8 mg/L (ref 50.0–100.0)

## 2020-03-15 NOTE — Telephone Encounter (Signed)
Ok to send labs to psychiatry. Levels are in normal range.

## 2020-04-07 ENCOUNTER — Ambulatory Visit: Payer: 59 | Admitting: Cardiology

## 2020-05-15 ENCOUNTER — Encounter: Payer: Self-pay | Admitting: Physician Assistant

## 2020-05-15 ENCOUNTER — Other Ambulatory Visit: Payer: Self-pay | Admitting: Physician Assistant

## 2020-05-15 DIAGNOSIS — Z79899 Other long term (current) drug therapy: Secondary | ICD-10-CM

## 2020-05-18 NOTE — Telephone Encounter (Signed)
Pending depakote level. CBC normal. All labs faxed to [facility].

## 2020-05-19 LAB — CBC WITH DIFFERENTIAL/PLATELET
Absolute Monocytes: 632 cells/uL (ref 200–950)
Basophils Absolute: 18 cells/uL (ref 0–200)
Basophils Relative: 0.2 %
Eosinophils Absolute: 0 cells/uL — ABNORMAL LOW (ref 15–500)
Eosinophils Relative: 0 %
HCT: 47.9 % (ref 38.5–50.0)
Hemoglobin: 16.7 g/dL (ref 13.2–17.1)
Lymphs Abs: 1753 cells/uL (ref 850–3900)
MCH: 30.5 pg (ref 27.0–33.0)
MCHC: 34.9 g/dL (ref 32.0–36.0)
MCV: 87.4 fL (ref 80.0–100.0)
MPV: 10.6 fL (ref 7.5–12.5)
Monocytes Relative: 7.1 %
Neutro Abs: 6497 cells/uL (ref 1500–7800)
Neutrophils Relative %: 73 %
Platelets: 174 10*3/uL (ref 140–400)
RBC: 5.48 10*6/uL (ref 4.20–5.80)
RDW: 12.8 % (ref 11.0–15.0)
Total Lymphocyte: 19.7 %
WBC: 8.9 10*3/uL (ref 3.8–10.8)

## 2020-05-19 LAB — VALPROIC ACID LEVEL: Valproic Acid Lvl: 65.1 mg/L (ref 50.0–100.0)

## 2020-05-19 NOTE — Telephone Encounter (Signed)
Needs labs sent to [facility] please see information in chart.  No concerns on labs.

## 2020-05-20 ENCOUNTER — Encounter: Payer: Self-pay | Admitting: Neurology

## 2020-05-30 ENCOUNTER — Telehealth (INDEPENDENT_AMBULATORY_CARE_PROVIDER_SITE_OTHER): Payer: 59 | Admitting: Family Medicine

## 2020-05-30 ENCOUNTER — Encounter: Payer: Self-pay | Admitting: Family Medicine

## 2020-05-30 DIAGNOSIS — A09 Infectious gastroenteritis and colitis, unspecified: Secondary | ICD-10-CM | POA: Diagnosis not present

## 2020-05-30 DIAGNOSIS — K529 Noninfective gastroenteritis and colitis, unspecified: Secondary | ICD-10-CM | POA: Diagnosis not present

## 2020-05-30 DIAGNOSIS — R112 Nausea with vomiting, unspecified: Secondary | ICD-10-CM

## 2020-05-30 MED ORDER — ONDANSETRON 8 MG PO TBDP: 8.0000 mg | ORAL_TABLET | Freq: Three times a day (TID) | ORAL | 1 refills | Status: DC | PRN

## 2020-05-30 MED ORDER — AZITHROMYCIN 500 MG PO TABS: ORAL_TABLET | ORAL | 0 refills | Status: DC

## 2020-05-30 NOTE — Patient Instructions (Signed)

## 2020-05-30 NOTE — Progress Notes (Signed)
Virtual Video Visit via MyChart Note  I connected with  Beth Spackman on 05/30/20 at 10:10 AM EDT by the video enabled telemedicine application for MyChart, and verified that I am speaking with the correct person using two identifiers.   I introduced myself as a Publishing rights manager with the practice. We discussed the limitations of evaluation and management by telemedicine and the availability of in person appointments. The patient expressed understanding and agreed to proceed.  Participating parties in this visit include: The patient and the nurse practitioner listed. The patient is: At home I am: In the office - Primary Care Kathryne Sharper  Subjective:    CC:  Chief Complaint  Patient presents with  . Emesis  . Diarrhea    HPI: Joshua Tyler is a 25 y.o. year old male presenting today via MyChart today for vomiting and diarrhea.  Patient reporting he and his wife were on a cruise all last week. On Friday night, his wife started to have nausea, vomiting, and diarrhea. They took her to the medical bay onboard and she was treated for traveler's diarrhea with IV fluids, zofran, and azithromycin for 3 days. Her symptoms have since improved, but she is still feeling weak and tired. The provider onboard suggested they may have ingested some bad water at one of their destinations on the cruise.   They returned from the cruise this weekend and starting last night, [name] had nausea with vomiting multiple times per hour from about 8pm to 5am. Around 4-5am, he started experiencing some diarrhea, but vomiting spaced out to about every 2 hours. He has not tried to eat anything since this began, but reports he has been trying to sip on fluids to stay hydrated. Other symptoms include some generalized 4/10 abdominal discomfort associated with vomiting and diarrhea, body aches, weakness/fatigue, mild headache.   He had a negative COVID test. He denies chest pain, shortness of breath, severe abdominal pains,  change in urination, fever.   Past medical history, Surgical history, Family history not pertinant except as noted below, Social history, Allergies, and medications have been entered into the medical record, reviewed, and corrections made.   Review of Systems:  All review of systems negative except what is listed in the HPI   Objective:    General:  Speaking clearly in complete sentences. Absent shortness of breath noted.   Alert and oriented x3.   Normal judgment.  Absent acute distress.   Impression and Recommendations:    1. Gastroenteritis, acute 2. Traveler's diarrhea 3. Non-intractable vomiting with nausea, unspecified vomiting type  Symptoms and course of illness consistent with viral gastroenteritis, however given the recent out of country travel, would keep traveler's diarrhea as a strong possibility as well. Since vomiting and diarrhea seem to have slowed down a bit so far this morning. Recommend he continue supportive therapy for the next day or so and keep an eye out for signs and symptoms of more severe infection. If symptoms do not continue to improve over the next day, he can take the three days of  Azithromycin to treat for traveler's diarrhea. Also sending in ODT Zofran as he has had trouble keeping anything oral down so far. Encouraged rest, hydration, slowly advance to BRAT diet once oral intake is possible. Educated on signs and symptoms requiring further evaluation.  - azithromycin (ZITHROMAX) 500 MG tablet; Take once daily for 3 days.  Dispense: 3 tablet; Refill: 0 - ondansetron (ZOFRAN-ODT) 8 MG disintegrating tablet; Take 1 tablet (8 mg total) by  mouth every 8 (eight) hours as needed for nausea or vomiting.  Dispense: 20 tablet; Refill: 1  Follow-up if symptoms worsen or fail to improve.    I discussed the assessment and treatment plan with the patient. The patient was provided an opportunity to ask questions and all were answered. The patient agreed with the  plan and demonstrated an understanding of the instructions.   The patient was advised to call back or seek an in-person evaluation if the symptoms worsen or if the condition fails to improve as anticipated.  I provided 20 minutes of non-face-to-face interaction with this MYCHART visit including intake, same-day documentation, and chart review.   Clayborne Dana, NP

## 2020-06-19 ENCOUNTER — Other Ambulatory Visit: Payer: Self-pay | Admitting: Physician Assistant

## 2020-06-20 NOTE — Telephone Encounter (Signed)
Patient was having orthostatic hypotension at last visit, should he still be on this medication?

## 2020-06-20 NOTE — Telephone Encounter (Signed)
Left message on machine for patient to call back.

## 2020-06-20 NOTE — Telephone Encounter (Signed)
Can we call patient and confirm if taking or not?

## 2020-06-23 ENCOUNTER — Other Ambulatory Visit: Payer: Self-pay | Admitting: Physician Assistant

## 2020-06-27 ENCOUNTER — Encounter: Payer: Self-pay | Admitting: Physician Assistant

## 2020-06-28 MED ORDER — METOPROLOL SUCCINATE ER 25 MG PO TB24: 1.0000 | ORAL_TABLET | Freq: Every day | ORAL | 0 refills | Status: DC

## 2020-07-15 ENCOUNTER — Other Ambulatory Visit: Payer: Self-pay | Admitting: Physician Assistant

## 2020-08-01 ENCOUNTER — Other Ambulatory Visit: Payer: Self-pay

## 2020-08-01 DIAGNOSIS — Z79899 Other long term (current) drug therapy: Secondary | ICD-10-CM

## 2020-08-01 NOTE — Progress Notes (Signed)
Pt called and asked for CBC and valproic acid labs for BH/Depakote management.

## 2020-08-02 NOTE — Progress Notes (Signed)
Please send

## 2020-08-03 ENCOUNTER — Telehealth (INDEPENDENT_AMBULATORY_CARE_PROVIDER_SITE_OTHER): Payer: 59 | Admitting: Physician Assistant

## 2020-08-03 ENCOUNTER — Encounter: Payer: Self-pay | Admitting: Physician Assistant

## 2020-08-03 VITALS — BP 128/86 | HR 103 | Ht 71.0 in | Wt 193.0 lb

## 2020-08-03 DIAGNOSIS — M25512 Pain in left shoulder: Secondary | ICD-10-CM

## 2020-08-03 DIAGNOSIS — R55 Syncope and collapse: Secondary | ICD-10-CM | POA: Diagnosis not present

## 2020-08-03 DIAGNOSIS — J301 Allergic rhinitis due to pollen: Secondary | ICD-10-CM | POA: Diagnosis not present

## 2020-08-03 DIAGNOSIS — R059 Cough, unspecified: Secondary | ICD-10-CM

## 2020-08-03 DIAGNOSIS — I498 Other specified cardiac arrhythmias: Secondary | ICD-10-CM | POA: Diagnosis not present

## 2020-08-03 DIAGNOSIS — I951 Orthostatic hypotension: Secondary | ICD-10-CM

## 2020-08-03 DIAGNOSIS — G90A Postural orthostatic tachycardia syndrome (POTS): Secondary | ICD-10-CM

## 2020-08-03 LAB — VALPROIC ACID LEVEL: Valproic Acid Lvl: 82.9 mg/L (ref 50.0–100.0)

## 2020-08-03 LAB — CBC WITH DIFFERENTIAL/PLATELET
Absolute Monocytes: 518 cells/uL (ref 200–950)
Basophils Absolute: 21 cells/uL (ref 0–200)
Basophils Relative: 0.3 %
Eosinophils Absolute: 0 cells/uL — ABNORMAL LOW (ref 15–500)
Eosinophils Relative: 0 %
HCT: 44.1 % (ref 38.5–50.0)
Hemoglobin: 15.2 g/dL (ref 13.2–17.1)
Lymphs Abs: 1736 cells/uL (ref 850–3900)
MCH: 30.5 pg (ref 27.0–33.0)
MCHC: 34.5 g/dL (ref 32.0–36.0)
MCV: 88.6 fL (ref 80.0–100.0)
MPV: 10.6 fL (ref 7.5–12.5)
Monocytes Relative: 7.4 %
Neutro Abs: 4725 cells/uL (ref 1500–7800)
Neutrophils Relative %: 67.5 %
Platelets: 162 10*3/uL (ref 140–400)
RBC: 4.98 10*6/uL (ref 4.20–5.80)
RDW: 13.5 % (ref 11.0–15.0)
Total Lymphocyte: 24.8 %
WBC: 7 10*3/uL (ref 3.8–10.8)

## 2020-08-03 NOTE — Progress Notes (Signed)
Depakote level great range!

## 2020-08-03 NOTE — Progress Notes (Signed)
Cough at night and in the AM No issues during the day 19months no med changes change doses. At night and in the morning. Wakes up  Wet film.  Feels like can't take a full breath in.  Never had ashtma.  Drainage every day.  claritin and flonase  Not mucinex.  No exercise intolerance.    Needs refills Metoprolol  Shoulder pain, 1-2 weeks, left  Has taken ibuprofen with no relief Did feel a pop while reaching for something but no other injury

## 2020-08-05 ENCOUNTER — Encounter: Payer: Self-pay | Admitting: Physician Assistant

## 2020-08-05 DIAGNOSIS — M25512 Pain in left shoulder: Secondary | ICD-10-CM | POA: Insufficient documentation

## 2020-08-05 DIAGNOSIS — R059 Cough, unspecified: Secondary | ICD-10-CM | POA: Insufficient documentation

## 2020-08-05 MED ORDER — METOPROLOL SUCCINATE ER 25 MG PO TB24: 1.0000 | ORAL_TABLET | Freq: Every day | ORAL | 3 refills | Status: DC

## 2020-08-05 MED ORDER — MONTELUKAST SODIUM 10 MG PO TABS: 10.0000 mg | ORAL_TABLET | Freq: Every day | ORAL | 3 refills | Status: DC

## 2020-08-05 MED ORDER — ALBUTEROL SULFATE HFA 108 (90 BASE) MCG/ACT IN AERS: 2.0000 | INHALATION_SPRAY | Freq: Four times a day (QID) | RESPIRATORY_TRACT | 0 refills | Status: DC | PRN

## 2020-08-05 NOTE — Progress Notes (Signed)
Patient ID: Joshua Tyler, male   DOB: [birth year], 25 y.o.   MRN: [id number] .Marland KitchenVirtual Visit via Video Note  I connected with Joshua Tyler on 08/03/2020 at 11:30 AM EDT by a video enabled telemedicine application and verified that I am speaking with the correct person using two identifiers.  Location: Patient: home Provider: clinic  .Marland KitchenParticipating in visit:  Patient: Joshua Tyler Provider: Tandy Gaw PA-C    I discussed the limitations of evaluation and management by telemedicine and the availability of in person appointments. The patient expressed understanding and agreed to proceed.  History of Present Illness: Patient is a 25 year old male with bipolar 1, POTS, orthostatic hypotension and near syncope who presents to the clinic with cough.  Patient has had an intermittent dry cough for the last 2 months.  Cough seems to be worse at night and in the morning.  Seems to go away during the day.  Denies any fever, chills, sinus pressure, ear pain, sore throat.  Cough has woken him up at night.  Patient denies any medication changes other than some dose changes.  He has never had a history of asthma.  He does have some ongoing throat drainage.  At times he feels like he cannot take a full deep breath in.  At times cough does feel little more productive.  Patient has a history of allergies and taking Claritin and Flonase daily.  He has not tried Mucinex.  Patient denies any exercise intolerance.  Patient has had some left shoulder and upper back pain for the last 1 to 2 weeks.  He is taking ibuprofen with little relief.  He does remember an injury when he went to reach for something and felt a pop in his left upper back.  He has not had the range of motion he would like since.  It hurts to abduct his shoulder to do any overhead lifting.     .. Active Ambulatory Problems    Diagnosis Date Noted  . Schizoaffective disorder, bipolar type (HCC) 10/10/2012  . Schizoaffective disorder (HCC) 07/21/2017   . Gastroesophageal reflux disease with esophagitis 07/21/2017  . Spleen enlarged 07/30/2017  . Status post cholecystectomy 09/30/2017  . Palpitations 05/01/2018  . Sinus tachycardia by electrocardiogram 10/27/2018  . PFO (patent foramen ovale) 10/28/2018  . Right axis deviation 10/28/2018  . Acute intractable headache 10/28/2018  . Abnormality of fibula 11/05/2018  . Bone thickening 11/07/2018  . Lower leg mass, left 11/07/2018  . Pain of left fibula 11/07/2018  . Left medial tibial stress syndrome 11/20/2018  . Deviated septum 06/03/2019  . Seasonal allergic rhinitis 12/07/2015  . Near syncope 01/29/2020  . POTS (postural orthostatic tachycardia syndrome) 01/29/2020  . tachycardia 01/29/2020  . Orthostatic hypotension 01/29/2020  . Abdominal cramping 09/17/2016  . ANA positive 10/26/2015  . Anxiety 05/23/2017  . OCD (obsessive compulsive disorder) 05/23/2017  . Chronic fatigue 11/17/2015  . Arthralgia 10/23/2015  . Generalized abdominal pain 04/25/2018  . GERD (gastroesophageal reflux disease) 07/10/2018  . High risk medication use 09/26/2015  . Hypersomnolence 09/17/2016  . Irritable bowel syndrome 07/10/2018  . Major depressive disorder, single episode 03/02/2011  . Nasal obstruction 10/12/2019  . Vitamin D deficiency 10/26/2015  . Cough 08/05/2020  . Acute pain of left shoulder 08/05/2020   Resolved Ambulatory Problems    Diagnosis Date Noted  . Dysuria 10/02/2017  . 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 Bipolar 1 disorder (HCC)     Observations/Objective:  No acute distress No cough or labored breathing during video call Normal mood and appearance.  Decreased ROM of left shoulder arm. Not able to abduct past 90 degrees.   .. Today's Vitals   08/03/20 1024  BP: 128/86  Pulse: (!) 103  Weight: 193 lb (87.5 kg)  Height: 5\' 11"  (1.803 m)   Body mass index is 26.92 kg/m.    Assessment and Plan: Marland KitchenDiagnoses  and all orders for this visit:  Cough -     montelukast (SINGULAIR) 10 MG tablet; Take 1 tablet (10 mg total) by mouth at bedtime.  Seasonal allergic rhinitis due to pollen -     montelukast (SINGULAIR) 10 MG tablet; Take 1 tablet (10 mg total) by mouth at bedtime.  Near syncope -     metoprolol succinate (TOPROL-XL) 25 MG 24 hr tablet; Take 1 tablet (25 mg total) by mouth daily.  POTS (postural orthostatic tachycardia syndrome) -     metoprolol succinate (TOPROL-XL) 25 MG 24 hr tablet; Take 1 tablet (25 mg total) by mouth daily.  Orthostatic hypotension -     metoprolol succinate (TOPROL-XL) 25 MG 24 hr tablet; Take 1 tablet (25 mg total) by mouth daily.  Acute pain of left shoulder   Cough-seems more allergic/drainage. Continue claritin/flonase and add singulair. Follow up as needed.   Syncope/POTS/Orthostatic hypotension- continue on metoprolol.   Left shoulder pain seems to be some rotator cuff injury.  Suggested starting exercises for this online.  If not improving will get x-ray and consider formal physical therapy and possibly MRI.  Patient will need in person visit with sports medicine or myself to further investigate and do in person physical exam.      Follow Up Instructions:    I discussed the assessment and treatment plan with the patient. The patient was provided an opportunity to ask questions and all were answered. The patient agreed with the plan and demonstrated an understanding of the instructions.   The patient was advised to call back or seek an in-person evaluation if the symptoms worsen or if the condition fails to improve as anticipated.    [name], PA-C

## 2020-08-30 ENCOUNTER — Telehealth: Payer: Self-pay | Admitting: Neurology

## 2020-08-30 DIAGNOSIS — Z79899 Other long term (current) drug therapy: Secondary | ICD-10-CM

## 2020-08-30 LAB — CBC WITH DIFFERENTIAL/PLATELET
Absolute Monocytes: 614 cells/uL (ref 200–950)
Basophils Absolute: 30 cells/uL (ref 0–200)
Basophils Relative: 0.3 %
Eosinophils Absolute: 0 cells/uL — ABNORMAL LOW (ref 15–500)
Eosinophils Relative: 0 %
HCT: 45.8 % (ref 38.5–50.0)
Hemoglobin: 16.2 g/dL (ref 13.2–17.1)
Lymphs Abs: 1901 cells/uL (ref 850–3900)
MCH: 31.4 pg (ref 27.0–33.0)
MCHC: 35.4 g/dL (ref 32.0–36.0)
MCV: 88.8 fL (ref 80.0–100.0)
MPV: 10.6 fL (ref 7.5–12.5)
Monocytes Relative: 6.2 %
Neutro Abs: 7356 cells/uL (ref 1500–7800)
Neutrophils Relative %: 74.3 %
Platelets: 174 10*3/uL (ref 140–400)
RBC: 5.16 10*6/uL (ref 4.20–5.80)
RDW: 13.3 % (ref 11.0–15.0)
Total Lymphocyte: 19.2 %
WBC: 9.9 10*3/uL (ref 3.8–10.8)

## 2020-08-30 NOTE — Telephone Encounter (Deleted)
Patient walked in and states [facility] now needs his CBC done weekly. Ordered.   Entered standing order for CBC for the ne

## 2020-08-30 NOTE — Progress Notes (Signed)
Send to [facility].

## 2020-08-30 NOTE — Telephone Encounter (Signed)
Patient walked in and states [facility] now needs his CBC done weekly. Ordered.   Entered standing order for CBC for the next 8 weeks.

## 2020-09-06 ENCOUNTER — Encounter: Payer: Self-pay | Admitting: Physician Assistant

## 2020-09-06 DIAGNOSIS — Z79899 Other long term (current) drug therapy: Secondary | ICD-10-CM

## 2020-09-09 ENCOUNTER — Other Ambulatory Visit: Payer: Self-pay

## 2020-09-09 ENCOUNTER — Ambulatory Visit (INDEPENDENT_AMBULATORY_CARE_PROVIDER_SITE_OTHER): Payer: 59

## 2020-09-09 DIAGNOSIS — Z01818 Encounter for other preprocedural examination: Secondary | ICD-10-CM

## 2020-09-09 DIAGNOSIS — Z79899 Other long term (current) drug therapy: Secondary | ICD-10-CM

## 2020-09-12 NOTE — Progress Notes (Signed)
No acute findings. Needs to be sent to [facility] to get approved for ECT.

## 2020-09-14 ENCOUNTER — Telehealth: Payer: Self-pay

## 2020-09-14 NOTE — Telephone Encounter (Signed)
Cervical x-ray faxed to Alfonso Patten at [facility].   Fax # [phone]

## 2020-09-26 ENCOUNTER — Other Ambulatory Visit: Payer: Self-pay | Admitting: Neurology

## 2020-09-26 DIAGNOSIS — Z79899 Other long term (current) drug therapy: Secondary | ICD-10-CM

## 2020-09-27 LAB — CBC WITH DIFFERENTIAL/PLATELET
Absolute Monocytes: 505 {cells}/uL (ref 200–950)
Basophils Absolute: 17 {cells}/uL (ref 0–200)
Basophils Relative: 0.2 %
Eosinophils Absolute: 0 {cells}/uL — ABNORMAL LOW (ref 15–500)
Eosinophils Relative: 0 %
HCT: 49.2 % (ref 38.5–50.0)
Hemoglobin: 16.6 g/dL (ref 13.2–17.1)
Lymphs Abs: 1575 {cells}/uL (ref 850–3900)
MCH: 30.7 pg (ref 27.0–33.0)
MCHC: 33.7 g/dL (ref 32.0–36.0)
MCV: 90.9 fL (ref 80.0–100.0)
MPV: 10.8 fL (ref 7.5–12.5)
Monocytes Relative: 5.8 %
Neutro Abs: 6603 {cells}/uL (ref 1500–7800)
Neutrophils Relative %: 75.9 %
Platelets: 187 Thousand/uL (ref 140–400)
RBC: 5.41 Million/uL (ref 4.20–5.80)
RDW: 13.1 % (ref 11.0–15.0)
Total Lymphocyte: 18.1 %
WBC: 8.7 Thousand/uL (ref 3.8–10.8)

## 2020-09-27 NOTE — Progress Notes (Signed)
No changes. Please fax to [facility].

## 2020-10-12 ENCOUNTER — Encounter: Payer: Self-pay | Admitting: Physician Assistant

## 2020-10-13 ENCOUNTER — Telehealth: Payer: Self-pay

## 2020-10-13 MED ORDER — DICYCLOMINE HCL 20 MG PO TABS: 20.0000 mg | ORAL_TABLET | Freq: Three times a day (TID) | ORAL | 2 refills | Status: DC | PRN

## 2020-10-13 NOTE — Telephone Encounter (Signed)
Pharmacy changed and medication sent.

## 2020-10-13 NOTE — Telephone Encounter (Signed)
Cordelia Pen from [facility] outpatient pharmacy called to advise there is a drug interaction with the Dicyclomine that was prescribed for patient. Please advise

## 2020-10-26 ENCOUNTER — Telehealth: Payer: Self-pay | Admitting: *Deleted

## 2020-10-26 DIAGNOSIS — Z79899 Other long term (current) drug therapy: Secondary | ICD-10-CM

## 2020-10-26 NOTE — Telephone Encounter (Signed)
Error

## 2020-10-27 LAB — CBC WITH DIFFERENTIAL/PLATELET
Absolute Monocytes: 461 cells/uL (ref 200–950)
Basophils Absolute: 19 cells/uL (ref 0–200)
Basophils Relative: 0.3 %
Eosinophils Absolute: 0 cells/uL — ABNORMAL LOW (ref 15–500)
Eosinophils Relative: 0 %
HCT: 46.8 % (ref 38.5–50.0)
Hemoglobin: 16 g/dL (ref 13.2–17.1)
Lymphs Abs: 1421 cells/uL (ref 850–3900)
MCH: 30.9 pg (ref 27.0–33.0)
MCHC: 34.2 g/dL (ref 32.0–36.0)
MCV: 90.3 fL (ref 80.0–100.0)
MPV: 11.6 fL (ref 7.5–12.5)
Monocytes Relative: 7.2 %
Neutro Abs: 4499 cells/uL (ref 1500–7800)
Neutrophils Relative %: 70.3 %
Platelets: 171 10*3/uL (ref 140–400)
RBC: 5.18 10*6/uL (ref 4.20–5.80)
RDW: 13.2 % (ref 11.0–15.0)
Total Lymphocyte: 22.2 %
WBC: 6.4 10*3/uL (ref 3.8–10.8)

## 2020-10-27 NOTE — Progress Notes (Signed)
Forward to [facility]. No concerns.

## 2020-11-29 ENCOUNTER — Other Ambulatory Visit: Payer: Self-pay | Admitting: Neurology

## 2020-11-30 LAB — CBC WITH DIFFERENTIAL/PLATELET
Absolute Monocytes: 442 cells/uL (ref 200–950)
Basophils Absolute: 27 cells/uL (ref 0–200)
Basophils Relative: 0.4 %
Eosinophils Absolute: 0 cells/uL — ABNORMAL LOW (ref 15–500)
Eosinophils Relative: 0 %
HCT: 45.3 % (ref 38.5–50.0)
Hemoglobin: 15.6 g/dL (ref 13.2–17.1)
Lymphs Abs: 1802 cells/uL (ref 850–3900)
MCH: 30.6 pg (ref 27.0–33.0)
MCHC: 34.4 g/dL (ref 32.0–36.0)
MCV: 89 fL (ref 80.0–100.0)
MPV: 10.4 fL (ref 7.5–12.5)
Monocytes Relative: 6.6 %
Neutro Abs: 4429 cells/uL (ref 1500–7800)
Neutrophils Relative %: 66.1 %
Platelets: 204 10*3/uL (ref 140–400)
RBC: 5.09 10*6/uL (ref 4.20–5.80)
RDW: 13.1 % (ref 11.0–15.0)
Total Lymphocyte: 26.9 %
WBC: 6.7 10*3/uL (ref 3.8–10.8)

## 2020-11-30 NOTE — Progress Notes (Signed)
Please fax to [facility]. No significant changes.

## 2020-12-14 ENCOUNTER — Other Ambulatory Visit: Payer: Self-pay | Admitting: Neurology

## 2020-12-14 DIAGNOSIS — Z79899 Other long term (current) drug therapy: Secondary | ICD-10-CM

## 2020-12-15 LAB — VALPROIC ACID LEVEL: Valproic Acid Lvl: 68 mg/L (ref 50.0–100.0)

## 2020-12-15 NOTE — Progress Notes (Signed)
Forward to [facility]. Normal levels.

## 2020-12-28 ENCOUNTER — Other Ambulatory Visit: Payer: Self-pay | Admitting: Neurology

## 2020-12-29 LAB — CBC WITH DIFFERENTIAL/PLATELET
Absolute Monocytes: 418 cells/uL (ref 200–950)
Basophils Absolute: 29 cells/uL (ref 0–200)
Basophils Relative: 0.4 %
Eosinophils Absolute: 0 cells/uL — ABNORMAL LOW (ref 15–500)
Eosinophils Relative: 0 %
HCT: 46.5 % (ref 38.5–50.0)
Hemoglobin: 16.2 g/dL (ref 13.2–17.1)
Lymphs Abs: 1958 cells/uL (ref 850–3900)
MCH: 30.9 pg (ref 27.0–33.0)
MCHC: 34.8 g/dL (ref 32.0–36.0)
MCV: 88.6 fL (ref 80.0–100.0)
MPV: 11.6 fL (ref 7.5–12.5)
Monocytes Relative: 5.8 %
Neutro Abs: 4795 cells/uL (ref 1500–7800)
Neutrophils Relative %: 66.6 %
Platelets: 205 10*3/uL (ref 140–400)
RBC: 5.25 10*6/uL (ref 4.20–5.80)
RDW: 13.2 % (ref 11.0–15.0)
Total Lymphocyte: 27.2 %
WBC: 7.2 10*3/uL (ref 3.8–10.8)

## 2021-01-02 NOTE — Progress Notes (Signed)
Stable CBC. Fax to [facility].

## 2021-01-06 ENCOUNTER — Encounter: Payer: Self-pay | Admitting: Physician Assistant

## 2021-01-06 DIAGNOSIS — G90A Postural orthostatic tachycardia syndrome (POTS): Secondary | ICD-10-CM

## 2021-01-06 DIAGNOSIS — I951 Orthostatic hypotension: Secondary | ICD-10-CM

## 2021-01-06 MED ORDER — MIDODRINE HCL 2.5 MG PO TABS: 2.5000 mg | ORAL_TABLET | Freq: Three times a day (TID) | ORAL | 0 refills | Status: DC

## 2021-01-12 ENCOUNTER — Ambulatory Visit (INDEPENDENT_AMBULATORY_CARE_PROVIDER_SITE_OTHER): Payer: No Typology Code available for payment source | Admitting: Physician Assistant

## 2021-01-12 ENCOUNTER — Other Ambulatory Visit: Payer: Self-pay | Admitting: Physician Assistant

## 2021-01-12 ENCOUNTER — Other Ambulatory Visit: Payer: Self-pay

## 2021-01-12 ENCOUNTER — Ambulatory Visit (INDEPENDENT_AMBULATORY_CARE_PROVIDER_SITE_OTHER): Payer: No Typology Code available for payment source

## 2021-01-12 VITALS — BP 109/77 | HR 112 | Ht 71.0 in | Wt 209.0 lb

## 2021-01-12 DIAGNOSIS — R1013 Epigastric pain: Secondary | ICD-10-CM | POA: Insufficient documentation

## 2021-01-12 DIAGNOSIS — N50812 Left testicular pain: Secondary | ICD-10-CM

## 2021-01-12 DIAGNOSIS — K580 Irritable bowel syndrome with diarrhea: Secondary | ICD-10-CM

## 2021-01-12 DIAGNOSIS — Z23 Encounter for immunization: Secondary | ICD-10-CM

## 2021-01-12 DIAGNOSIS — Z79899 Other long term (current) drug therapy: Secondary | ICD-10-CM

## 2021-01-12 LAB — POCT URINALYSIS DIP (CLINITEK)
Bilirubin, UA: NEGATIVE
Blood, UA: NEGATIVE
Glucose, UA: NEGATIVE mg/dL
Ketones, POC UA: NEGATIVE mg/dL
Leukocytes, UA: NEGATIVE
Nitrite, UA: NEGATIVE
POC PROTEIN,UA: NEGATIVE
Spec Grav, UA: 1.01 (ref 1.010–1.025)
Urobilinogen, UA: 0.2 E.U./dL
pH, UA: 7 (ref 5.0–8.0)

## 2021-01-12 MED ORDER — RIFAXIMIN 200 MG PO TABS: 200.0000 mg | ORAL_TABLET | Freq: Three times a day (TID) | ORAL | 0 refills | Status: DC

## 2021-01-12 NOTE — Patient Instructions (Signed)
Start xifaxin for 14 days.  Will get labs and ultrasound.

## 2021-01-12 NOTE — Progress Notes (Signed)
Subjective:    Patient ID: Joshua Tyler, male    DOB: 1996/01/31, 25 y.o.   MRN: [id number]  HPI Pt is a 25 yo male who presents to the clinic with diarrhea and left testicular pain.   Pt has IBS-D but controlled. 3-4 weeks of diarrhea and scared to leave house at some points. Some abdominal cramping. No melena or hematochezia. Imodium does help. No fever, chills, body aches. Taking omeprazole. No reflux.  1 week ago started having left achy intermittent testicular pain. No mass, swelling, redness, injury. Pain comes and goes for 10 sec at a time. Pain can happen at rest or with exertion. No pain with urination.   .. Active Ambulatory Problems    Diagnosis Date Noted   Schizoaffective disorder, bipolar type (HCC) 10/10/2012   Schizoaffective disorder (HCC) 07/21/2017   Gastroesophageal reflux disease with esophagitis 07/21/2017   Spleen enlarged 07/30/2017   Status post cholecystectomy 09/30/2017   Palpitations 05/01/2018   Sinus tachycardia by electrocardiogram 10/27/2018   PFO (patent foramen ovale) 10/28/2018   Right axis deviation 10/28/2018   Acute intractable headache 10/28/2018   Abnormality of fibula 11/05/2018   Bone thickening 11/07/2018   Lower leg mass, left 11/07/2018   Pain of left fibula 11/07/2018   Left medial tibial stress syndrome 11/20/2018   Deviated septum 06/03/2019   Seasonal allergic rhinitis 12/07/2015   Near syncope 01/29/2020   POTS (postural orthostatic tachycardia syndrome) 01/29/2020   tachycardia 01/29/2020   Orthostatic hypotension 01/29/2020   Abdominal cramping 09/17/2016   ANA positive 10/26/2015   Anxiety 05/23/2017   OCD (obsessive compulsive disorder) 05/23/2017   Chronic fatigue 11/17/2015   Arthralgia 10/23/2015   Generalized abdominal pain 04/25/2018   GERD (gastroesophageal reflux disease) 07/10/2018   High risk medication use 09/26/2015   Hypersomnolence 09/17/2016   Irritable bowel syndrome 07/10/2018   Major depressive  disorder, single episode 03/02/2011   Nasal obstruction 10/12/2019   Vitamin D deficiency 10/26/2015   Cough 08/05/2020   Acute pain of left shoulder 08/05/2020   Epigastric pain 01/12/2021   Left testicular pain 01/12/2021   Resolved Ambulatory Problems    Diagnosis Date Noted   Dysuria 10/02/2017   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     Objective:   Physical Exam Vitals reviewed.  Constitutional:      Appearance: Normal appearance.  HENT:     Head: Normocephalic.  Cardiovascular:     Rate and Rhythm: Normal rate and regular rhythm.     Pulses: Normal pulses.     Heart sounds: Normal heart sounds.  Pulmonary:     Effort: Pulmonary effort is normal.     Breath sounds: Normal breath sounds.  Abdominal:     General: Bowel sounds are normal.     Palpations: Abdomen is soft.     Tenderness: There is no abdominal tenderness. There is no right CVA tenderness, left CVA tenderness, guarding or rebound.  Musculoskeletal:     Right lower leg: No edema.     Left lower leg: No edema.  Lymphadenopathy:     Cervical: No cervical adenopathy.  Neurological:     General: No focal deficit present.     Mental Status: He is alert and oriented to person, place, and time.  Psychiatric:        Mood and Affect: Mood normal.  Assessment & Plan:  Marland KitchenMarland KitchenArther was seen today for irritable bowel syndrome.  Diagnoses and all orders for this visit:  Irritable bowel syndrome with diarrhea -     rifaximin (XIFAXAN) 200 MG tablet; Take 1 tablet (200 mg total) by mouth 3 (three) times daily. -     Lipase -     COMPLETE METABOLIC PANEL WITH GFR  Flu vaccine need -     Flu Vaccine QUAD 98mo+IM (Fluarix, Fluzone & Alfiuria Quad PF)  Epigastric pain -     Lipase -     COMPLETE METABOLIC PANEL WITH GFR  Medication management -     Valproic Acid level  Left testicular pain -      US SCROTUM DOPPLER; Future -     POCT URINALYSIS DIP (CLINITEK)  IBS-D known. Check lipiase, cbc, cmp. Controlled  IBS for a while until recent exacerbation. Good candidate for xifaxin for 14 days. Continue on bentyl.   U/S to evaluate testicular pain. Ok for icing and NSAIDs.

## 2021-01-13 LAB — COMPLETE METABOLIC PANEL WITH GFR
AG Ratio: 2.2 (calc) (ref 1.0–2.5)
ALT: 16 U/L (ref 9–46)
AST: 17 U/L (ref 10–40)
Albumin: 4.9 g/dL (ref 3.6–5.1)
Alkaline phosphatase (APISO): 76 U/L (ref 36–130)
BUN: 18 mg/dL (ref 7–25)
CO2: 30 mmol/L (ref 20–32)
Calcium: 9.6 mg/dL (ref 8.6–10.3)
Chloride: 99 mmol/L (ref 98–110)
Creat: 1.11 mg/dL (ref 0.60–1.24)
Globulin: 2.2 g/dL (calc) (ref 1.9–3.7)
Glucose, Bld: 81 mg/dL (ref 65–99)
Potassium: 4.1 mmol/L (ref 3.5–5.3)
Sodium: 138 mmol/L (ref 135–146)
Total Bilirubin: 1.4 mg/dL — ABNORMAL HIGH (ref 0.2–1.2)
Total Protein: 7.1 g/dL (ref 6.1–8.1)
eGFR: 94 mL/min/{1.73_m2} (ref >=60)

## 2021-01-13 LAB — VALPROIC ACID LEVEL: Valproic Acid Lvl: 47.3 mg/L — ABNORMAL LOW (ref 50.0–100.0)

## 2021-01-13 LAB — LIPASE: Lipase: 19 U/L (ref 7–60)

## 2021-01-13 NOTE — Progress Notes (Signed)
[place],   Lipase normal.  Depakote level is a little low. Please send to [place].  Kidney and liver look good.

## 2021-01-16 ENCOUNTER — Encounter: Payer: Self-pay | Admitting: Physician Assistant

## 2021-01-16 NOTE — Progress Notes (Signed)
Very small fluid collection in right testicle but no significant findings.

## 2021-02-28 ENCOUNTER — Ambulatory Visit: Payer: No Typology Code available for payment source | Admitting: Physician Assistant

## 2021-03-01 ENCOUNTER — Other Ambulatory Visit: Payer: Self-pay

## 2021-03-01 ENCOUNTER — Ambulatory Visit: Payer: No Typology Code available for payment source | Admitting: Physician Assistant

## 2021-03-02 LAB — CBC WITH DIFFERENTIAL/PLATELET
Absolute Monocytes: 310 cells/uL (ref 200–950)
Basophils Absolute: 12 cells/uL (ref 0–200)
Basophils Relative: 0.2 %
Eosinophils Absolute: 0 cells/uL — ABNORMAL LOW (ref 15–500)
Eosinophils Relative: 0 %
HCT: 45.9 % (ref 38.5–50.0)
Hemoglobin: 15.8 g/dL (ref 13.2–17.1)
Lymphs Abs: 1705 cells/uL (ref 850–3900)
MCH: 30.6 pg (ref 27.0–33.0)
MCHC: 34.4 g/dL (ref 32.0–36.0)
MCV: 88.8 fL (ref 80.0–100.0)
MPV: 10.9 fL (ref 7.5–12.5)
Monocytes Relative: 5 %
Neutro Abs: 4173 cells/uL (ref 1500–7800)
Neutrophils Relative %: 67.3 %
Platelets: 176 10*3/uL (ref 140–400)
RBC: 5.17 10*6/uL (ref 4.20–5.80)
RDW: 13.3 % (ref 11.0–15.0)
Total Lymphocyte: 27.5 %
WBC: 6.2 10*3/uL (ref 3.8–10.8)

## 2021-03-02 NOTE — Progress Notes (Signed)
Please fax to [facility]. Stable CBC.

## 2021-03-06 ENCOUNTER — Encounter: Payer: Self-pay | Admitting: Physician Assistant

## 2021-03-06 ENCOUNTER — Ambulatory Visit: Payer: No Typology Code available for payment source | Admitting: Physician Assistant

## 2021-03-06 ENCOUNTER — Other Ambulatory Visit: Payer: Self-pay

## 2021-03-06 DIAGNOSIS — I951 Orthostatic hypotension: Secondary | ICD-10-CM | POA: Diagnosis not present

## 2021-03-06 DIAGNOSIS — G90A Postural orthostatic tachycardia syndrome (POTS): Secondary | ICD-10-CM

## 2021-03-06 MED ORDER — MIDODRINE HCL 2.5 MG PO TABS: 2.5000 mg | ORAL_TABLET | Freq: Three times a day (TID) | ORAL | 3 refills | Status: DC

## 2021-03-06 NOTE — Progress Notes (Signed)
° °  Subjective:    Patient ID: Joshua Tyler, male    DOB: [birth year], 25 y.o.   MRN: [id number]  HPI Pt is a 25 yo male with POTS who presents to the clinic with refill of midodrine. He has been out for 2 weeks and HR has been higher. Denies any CP, palpitations, headaches, or dizziness.   .. Active Ambulatory Problems    Diagnosis Date Noted   Schizoaffective disorder, bipolar type (HCC) 10/10/2012   Schizoaffective disorder (HCC) 07/21/2017   Gastroesophageal reflux disease with esophagitis 07/21/2017   Spleen enlarged 07/30/2017   Status post cholecystectomy 09/30/2017   Palpitations 05/01/2018   Sinus tachycardia by electrocardiogram 10/27/2018   PFO (patent foramen ovale) 10/28/2018   Right axis deviation 10/28/2018   Acute intractable headache 10/28/2018   Abnormality of fibula 11/05/2018   Bone thickening 11/07/2018   Lower leg mass, left 11/07/2018   Pain of left fibula 11/07/2018   Left medial tibial stress syndrome 11/20/2018   Deviated septum 06/03/2019   Seasonal allergic rhinitis 12/07/2015   Near syncope 01/29/2020   POTS (postural orthostatic tachycardia syndrome) 01/29/2020   tachycardia 01/29/2020   Orthostatic hypotension 01/29/2020   Abdominal cramping 09/17/2016   ANA positive 10/26/2015   Anxiety 05/23/2017   OCD (obsessive compulsive disorder) 05/23/2017   Chronic fatigue 11/17/2015   Arthralgia 10/23/2015   Generalized abdominal pain 04/25/2018   GERD (gastroesophageal reflux disease) 07/10/2018   High risk medication use 09/26/2015   Hypersomnolence 09/17/2016   Irritable bowel syndrome 07/10/2018   Major depressive disorder, single episode 03/02/2011   Nasal obstruction 10/12/2019   Vitamin D deficiency 10/26/2015   Cough 08/05/2020   Acute pain of left shoulder 08/05/2020   Epigastric pain 01/12/2021   Left testicular pain 01/12/2021   Resolved Ambulatory Problems    Diagnosis Date Noted   Dysuria 10/02/2017   Left to right cardiac shunt  10/28/2018   Past Medical History:  Diagnosis Date   Bipolar 1 disorder (HCC)        Review of Systems  See HPI.     Objective:   Physical Exam Vitals reviewed.  Constitutional:      Appearance: Normal appearance.  HENT:     Head: Normocephalic.  Cardiovascular:     Rate and Rhythm: Regular rhythm. Tachycardia present.     Pulses: Normal pulses.     Heart sounds: Normal heart sounds.  Pulmonary:     Effort: Pulmonary effort is normal.  Neurological:     General: No focal deficit present.     Mental Status: He is alert.  Psychiatric:        Mood and Affect: Mood normal.          Assessment & Plan:  Marland KitchenMarland KitchenDiagnoses and all orders for this visit:  Orthostatic hypotension -     midodrine (PROAMATINE) 2.5 MG tablet; Take 1 tablet (2.5 mg total) by mouth 3 (three) times daily with meals.  POTS (postural orthostatic tachycardia syndrome) -     midodrine (PROAMATINE) 2.5 MG tablet; Take 1 tablet (2.5 mg total) by mouth 3 (three) times daily with meals.    Labs UTD. Refilled for 1 year.

## 2021-03-30 ENCOUNTER — Other Ambulatory Visit: Payer: Self-pay

## 2021-03-30 LAB — CBC WITH DIFFERENTIAL/PLATELET
Absolute Monocytes: 350 cells/uL (ref 200–950)
Basophils Absolute: 20 cells/uL (ref 0–200)
Basophils Relative: 0.4 %
Eosinophils Absolute: 0 cells/uL — ABNORMAL LOW (ref 15–500)
Eosinophils Relative: 0 %
HCT: 43.5 % (ref 38.5–50.0)
Hemoglobin: 15.4 g/dL (ref 13.2–17.1)
Lymphs Abs: 1825 cells/uL (ref 850–3900)
MCH: 31.6 pg (ref 27.0–33.0)
MCHC: 35.4 g/dL (ref 32.0–36.0)
MCV: 89.1 fL (ref 80.0–100.0)
MPV: 10.7 fL (ref 7.5–12.5)
Monocytes Relative: 7 %
Neutro Abs: 2805 cells/uL (ref 1500–7800)
Neutrophils Relative %: 56.1 %
Platelets: 154 10*3/uL (ref 140–400)
RBC: 4.88 10*6/uL (ref 4.20–5.80)
RDW: 13.4 % (ref 11.0–15.0)
Total Lymphocyte: 36.5 %
WBC: 5 10*3/uL (ref 3.8–10.8)

## 2021-03-31 NOTE — Progress Notes (Signed)
Please send to [facility]. Stable CBC.

## 2021-04-17 ENCOUNTER — Other Ambulatory Visit: Payer: Self-pay | Admitting: Physician Assistant

## 2021-04-17 DIAGNOSIS — G90A Postural orthostatic tachycardia syndrome (POTS): Secondary | ICD-10-CM

## 2021-04-17 DIAGNOSIS — I951 Orthostatic hypotension: Secondary | ICD-10-CM

## 2021-04-24 ENCOUNTER — Encounter: Payer: Self-pay | Admitting: Physician Assistant

## 2021-04-24 ENCOUNTER — Other Ambulatory Visit: Payer: Self-pay

## 2021-04-24 DIAGNOSIS — F319 Bipolar disorder, unspecified: Secondary | ICD-10-CM

## 2021-04-24 DIAGNOSIS — F250 Schizoaffective disorder, bipolar type: Secondary | ICD-10-CM

## 2021-04-24 NOTE — Progress Notes (Signed)
Ordered labs per [employer].

## 2021-04-26 NOTE — Progress Notes (Signed)
Stable. To be sent to [facility].

## 2021-04-28 ENCOUNTER — Other Ambulatory Visit: Payer: Self-pay

## 2021-04-28 ENCOUNTER — Encounter: Payer: Self-pay | Admitting: Physician Assistant

## 2021-04-28 DIAGNOSIS — I951 Orthostatic hypotension: Secondary | ICD-10-CM

## 2021-04-28 DIAGNOSIS — G90A Postural orthostatic tachycardia syndrome (POTS): Secondary | ICD-10-CM

## 2021-04-28 MED ORDER — MIDODRINE HCL 2.5 MG PO TABS: 2.5000 mg | ORAL_TABLET | Freq: Three times a day (TID) | ORAL | 3 refills | Status: DC

## 2021-05-01 ENCOUNTER — Encounter: Payer: Self-pay | Admitting: Physician Assistant

## 2021-05-01 LAB — CBC WITH DIFFERENTIAL/PLATELET
Absolute Monocytes: 504 cells/uL (ref 200–950)
Basophils Absolute: 7 cells/uL (ref 0–200)
Basophils Relative: 0.1 %
Eosinophils Absolute: 0 cells/uL — ABNORMAL LOW (ref 15–500)
Eosinophils Relative: 0 %
HCT: 46.9 % (ref 38.5–50.0)
Hemoglobin: 16 g/dL (ref 13.2–17.1)
Lymphs Abs: 2066 cells/uL (ref 850–3900)
MCH: 30.4 pg (ref 27.0–33.0)
MCHC: 34.1 g/dL (ref 32.0–36.0)
MCV: 89.2 fL (ref 80.0–100.0)
MPV: 10.9 fL (ref 7.5–12.5)
Monocytes Relative: 6.9 %
Neutro Abs: 4723 cells/uL (ref 1500–7800)
Neutrophils Relative %: 64.7 %
Platelets: 155 10*3/uL (ref 140–400)
RBC: 5.26 10*6/uL (ref 4.20–5.80)
RDW: 13.1 % (ref 11.0–15.0)
Total Lymphocyte: 28.3 %
WBC: 7.3 10*3/uL (ref 3.8–10.8)

## 2021-05-01 LAB — CLOZAPINE (CLOZARIL)
Clozapine Lvl: 975 mcg/L — CR
NorClozapine: 281 mcg/L (ref 25–400)

## 2021-05-01 NOTE — Progress Notes (Signed)
We need to make contact with [facility] today and patient to contact [place] [facility] if we cannot contact I will make a call on how much to decrease until contact made with [facility].

## 2021-05-05 ENCOUNTER — Other Ambulatory Visit: Payer: Self-pay | Admitting: Neurology

## 2021-05-05 DIAGNOSIS — F250 Schizoaffective disorder, bipolar type: Secondary | ICD-10-CM

## 2021-05-05 DIAGNOSIS — F319 Bipolar disorder, unspecified: Secondary | ICD-10-CM

## 2021-05-09 LAB — CLOZAPINE (CLOZARIL)
Clozapine Lvl: 700 mcg/L
NorClozapine: 233 mcg/L (ref 25–400)

## 2021-05-10 NOTE — Progress Notes (Signed)
Improving. Please send to [facility].

## 2021-05-16 ENCOUNTER — Ambulatory Visit (INDEPENDENT_AMBULATORY_CARE_PROVIDER_SITE_OTHER): Payer: PRIVATE HEALTH INSURANCE | Admitting: Physician Assistant

## 2021-05-16 ENCOUNTER — Encounter: Payer: Self-pay | Admitting: Physician Assistant

## 2021-05-16 ENCOUNTER — Other Ambulatory Visit: Payer: Self-pay

## 2021-05-16 VITALS — BP 114/75 | HR 104 | Temp 97.5°F | Ht 71.0 in | Wt 206.0 lb

## 2021-05-16 DIAGNOSIS — J0140 Acute pansinusitis, unspecified: Secondary | ICD-10-CM | POA: Diagnosis not present

## 2021-05-16 DIAGNOSIS — R051 Acute cough: Secondary | ICD-10-CM | POA: Diagnosis not present

## 2021-05-16 MED ORDER — BENZONATATE 200 MG PO CAPS: 200.0000 mg | ORAL_CAPSULE | Freq: Three times a day (TID) | ORAL | 0 refills | Status: DC | PRN

## 2021-05-16 MED ORDER — AMOXICILLIN-POT CLAVULANATE 875-125 MG PO TABS: 1.0000 | ORAL_TABLET | Freq: Two times a day (BID) | ORAL | 0 refills | Status: AC

## 2021-05-16 NOTE — Patient Instructions (Signed)

## 2021-05-16 NOTE — Progress Notes (Signed)
Subjective:    Patient ID: Joshua Tyler, male    DOB: 1996-03-17, 26 y.o.   MRN: [id number]  HPI Pt is a 26 yo male who presents to the clinic with over a week of URI symptoms. Home covid test negative. Some cough but mostly sinus pressure, drainage, headache, ears popping. No fever, chills, SOB. Taking flonase, mucinex, tylenol, delsym with little relief. Blowing out green discharge.   .. Active Ambulatory Problems    Diagnosis Date Noted   Schizoaffective disorder, bipolar type (HCC) 10/10/2012   Schizoaffective disorder (HCC) 07/21/2017   Gastroesophageal reflux disease with esophagitis 07/21/2017   Spleen enlarged 07/30/2017   Status post cholecystectomy 09/30/2017   Palpitations 05/01/2018   Sinus tachycardia by electrocardiogram 10/27/2018   PFO (patent foramen ovale) 10/28/2018   Right axis deviation 10/28/2018   Acute intractable headache 10/28/2018   Abnormality of fibula 11/05/2018   Bone thickening 11/07/2018   Lower leg mass, left 11/07/2018   Pain of left fibula 11/07/2018   Left medial tibial stress syndrome 11/20/2018   Deviated septum 06/03/2019   Seasonal allergic rhinitis 12/07/2015   Near syncope 01/29/2020   POTS (postural orthostatic tachycardia syndrome) 01/29/2020   tachycardia 01/29/2020   Orthostatic hypotension 01/29/2020   Abdominal cramping 09/17/2016   ANA positive 10/26/2015   Anxiety 05/23/2017   OCD (obsessive compulsive disorder) 05/23/2017   Chronic fatigue 11/17/2015   Arthralgia 10/23/2015   Generalized abdominal pain 04/25/2018   GERD (gastroesophageal reflux disease) 07/10/2018   High risk medication use 09/26/2015   Hypersomnolence 09/17/2016   Irritable bowel syndrome 07/10/2018   Major depressive disorder, single episode 03/02/2011   Nasal obstruction 10/12/2019   Vitamin D deficiency 10/26/2015   Cough 08/05/2020   Acute pain of left shoulder 08/05/2020   Epigastric pain 01/12/2021   Left testicular pain 01/12/2021    Resolved Ambulatory Problems    Diagnosis Date Noted   Dysuria 10/02/2017   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     Objective:   Physical Exam Vitals reviewed.  Constitutional:      Appearance: Normal appearance.  HENT:     Head: Normocephalic.     Comments: Tenderness over maxillary and frontal sinuses to palpation    Right Ear: Tympanic membrane, ear canal and external ear normal. There is no impacted cerumen.     Left Ear: Tympanic membrane, ear canal and external ear normal.     Nose: Congestion present.     Mouth/Throat:     Mouth: Mucous membranes are moist.     Pharynx: Posterior oropharyngeal erythema present. No oropharyngeal exudate.  Eyes:     General:        Right eye: No discharge.        Left eye: No discharge.     Extraocular Movements: Extraocular movements intact.     Conjunctiva/sclera: Conjunctivae normal.     Pupils: Pupils are equal, round, and reactive to light.  Cardiovascular:     Rate and Rhythm: Normal rate and regular rhythm.     Pulses: Normal pulses.     Heart sounds: Normal heart sounds.  Pulmonary:     Effort: Pulmonary effort is normal.     Breath sounds: Normal breath sounds.  Lymphadenopathy:     Cervical: No cervical adenopathy.  Neurological:     General: No focal deficit present.  Mental Status: He is alert and oriented to person, place, and time.  Psychiatric:        Mood and Affect: Mood normal.          Assessment & Plan:  Marland KitchenMarland KitchenDomenik was seen today for sinus problem.  Diagnoses and all orders for this visit:  Acute non-recurrent pansinusitis -     benzonatate (TESSALON) 200 MG capsule; Take 1 capsule (200 mg total) by mouth 3 (three) times daily as needed. -     amoxicillin-clavulanate (AUGMENTIN) 875-125 MG tablet; Take 1 tablet by mouth 2 (two) times daily for 10 days.  Acute cough -     benzonatate  (TESSALON) 200 MG capsule; Take 1 capsule (200 mg total) by mouth 3 (three) times daily as needed.   Covid home test negative Treated for sinusitis with augmentin and tessalon pearls Continue nasal saline washes and nasal steroids. Follow up as needed or if symptoms worsen.

## 2021-05-19 ENCOUNTER — Telehealth: Payer: Self-pay

## 2021-05-19 NOTE — Telephone Encounter (Signed)
Has he used any different creams, soaps, lotions on skin? Abx can make you skin more sensitive to the sun.

## 2021-05-19 NOTE — Telephone Encounter (Signed)
Joshua Tyler has been taking the Augmentin since prescribed on the 27 th. He woke this morning with redness and burning on his forehead. No hives, shortness of breath or swelling in mouth.  ?

## 2021-05-19 NOTE — Telephone Encounter (Signed)
Unable to reach patient, voicemail is full.

## 2021-05-26 LAB — CLOZAPINE (CLOZARIL)
Clozapine Lvl: 645 mcg/L
NorClozapine: 166 mcg/L (ref 25–400)

## 2021-05-29 NOTE — Progress Notes (Signed)
Please send to [facility]. Numbers look much better.

## 2021-06-03 ENCOUNTER — Encounter: Payer: Self-pay | Admitting: Physician Assistant

## 2021-06-03 DIAGNOSIS — Z79899 Other long term (current) drug therapy: Secondary | ICD-10-CM

## 2021-06-03 LAB — CBC WITH DIFFERENTIAL/PLATELET
Absolute Monocytes: 601 cells/uL (ref 200–950)
Basophils Absolute: 23 cells/uL (ref 0–200)
Basophils Relative: 0.3 %
Eosinophils Absolute: 0 cells/uL — ABNORMAL LOW (ref 15–500)
Eosinophils Relative: 0 %
HCT: 44.7 % (ref 38.5–50.0)
Hemoglobin: 15.4 g/dL (ref 13.2–17.1)
Lymphs Abs: 1940 cells/uL (ref 850–3900)
MCH: 30.5 pg (ref 27.0–33.0)
MCHC: 34.5 g/dL (ref 32.0–36.0)
MCV: 88.5 fL (ref 80.0–100.0)
MPV: 11.1 fL (ref 7.5–12.5)
Monocytes Relative: 7.8 %
Neutro Abs: 5136 cells/uL (ref 1500–7800)
Neutrophils Relative %: 66.7 %
Platelets: 197 10*3/uL (ref 140–400)
RBC: 5.05 10*6/uL (ref 4.20–5.80)
RDW: 13.3 % (ref 11.0–15.0)
Total Lymphocyte: 25.2 %
WBC: 7.7 10*3/uL (ref 3.8–10.8)

## 2021-06-06 NOTE — Progress Notes (Signed)
Normal. Fax to [facility]

## 2021-06-09 LAB — CLOZAPINE (CLOZARIL)
Clozapine Lvl: 500 mcg/L
NorClozapine: 131 mcg/L (ref 25–400)

## 2021-06-09 LAB — VALPROIC ACID LEVEL: Valproic Acid Lvl: 82.6 mg/L (ref 50.0–100.0)

## 2021-06-09 NOTE — Progress Notes (Signed)
More labs to send to Dr. Sanjuan Dame.

## 2021-06-28 NOTE — Telephone Encounter (Signed)
Order printed. Ok to fax.

## 2021-07-06 NOTE — Progress Notes (Signed)
Hi Joshua Tyler, LDL cholesterol is up a little bit compared to last year it was 96 it is now 122.  Just want you to continue to work on healthy food choices.  Limiting carbs and sweets and increasing vegetables and making sure that you are getting a total of 150 minutes of exercise per week.  Metabolic panel is normal.  No sign of diabetes.  Additional labs are still pending.

## 2021-07-08 LAB — HEMOGLOBIN A1C
Hgb A1c MFr Bld: 4.5 % of total Hgb (ref <5.7)
Mean Plasma Glucose: 82 mg/dL
eAG (mmol/L): 4.6 mmol/L

## 2021-07-08 LAB — COMPLETE METABOLIC PANEL WITH GFR
AG Ratio: 2.4 (calc) (ref 1.0–2.5)
ALT: 16 U/L (ref 9–46)
AST: 14 U/L (ref 10–40)
Albumin: 4.5 g/dL (ref 3.6–5.1)
Alkaline phosphatase (APISO): 70 U/L (ref 36–130)
BUN: 16 mg/dL (ref 7–25)
CO2: 26 mmol/L (ref 20–32)
Calcium: 9.2 mg/dL (ref 8.6–10.3)
Chloride: 102 mmol/L (ref 98–110)
Creat: 0.99 mg/dL (ref 0.60–1.24)
Globulin: 1.9 g/dL (calc) (ref 1.9–3.7)
Glucose, Bld: 89 mg/dL (ref 65–99)
Potassium: 4.2 mmol/L (ref 3.5–5.3)
Sodium: 137 mmol/L (ref 135–146)
Total Bilirubin: 0.7 mg/dL (ref 0.2–1.2)
Total Protein: 6.4 g/dL (ref 6.1–8.1)
eGFR: 108 mL/min/{1.73_m2} (ref >=60)

## 2021-07-08 LAB — LIPID PANEL W/REFLEX DIRECT LDL
Cholesterol: 188 mg/dL (ref <200)
HDL: 35 mg/dL — ABNORMAL LOW (ref >=40)
LDL Cholesterol (Calc): 122 mg/dL (calc) — ABNORMAL HIGH
Non-HDL Cholesterol (Calc): 153 mg/dL (calc) — ABNORMAL HIGH (ref <130)
Total CHOL/HDL Ratio: 5.4 (calc) — ABNORMAL HIGH (ref <5.0)
Triglycerides: 191 mg/dL — ABNORMAL HIGH (ref <150)

## 2021-07-08 LAB — CLOZAPINE (CLOZARIL)
Clozapine Lvl: 504 mcg/L
NorClozapine: 110 mcg/L (ref 25–400)

## 2021-07-09 ENCOUNTER — Other Ambulatory Visit: Payer: Self-pay | Admitting: Physician Assistant

## 2021-07-10 ENCOUNTER — Encounter: Payer: Self-pay | Admitting: Physician Assistant

## 2021-07-10 NOTE — Progress Notes (Signed)
Clozapine level is normal.

## 2021-07-28 ENCOUNTER — Other Ambulatory Visit: Payer: Self-pay

## 2021-07-28 DIAGNOSIS — F250 Schizoaffective disorder, bipolar type: Secondary | ICD-10-CM

## 2021-07-28 DIAGNOSIS — F319 Bipolar disorder, unspecified: Secondary | ICD-10-CM

## 2021-07-30 ENCOUNTER — Other Ambulatory Visit: Payer: Self-pay | Admitting: Physician Assistant

## 2021-08-01 ENCOUNTER — Other Ambulatory Visit: Payer: Self-pay | Admitting: Physician Assistant

## 2021-08-01 DIAGNOSIS — Z79899 Other long term (current) drug therapy: Secondary | ICD-10-CM

## 2021-08-02 NOTE — Progress Notes (Signed)
Looks good. Please fax to [facility].

## 2021-08-05 LAB — CBC WITH DIFFERENTIAL/PLATELET
Absolute Monocytes: 398 cells/uL (ref 200–950)
Basophils Absolute: 31 cells/uL (ref 0–200)
Basophils Relative: 0.6 %
Eosinophils Absolute: 0 cells/uL — ABNORMAL LOW (ref 15–500)
Eosinophils Relative: 0 %
HCT: 47.6 % (ref 38.5–50.0)
Hemoglobin: 16.3 g/dL (ref 13.2–17.1)
Lymphs Abs: 1887 cells/uL (ref 850–3900)
MCH: 30.6 pg (ref 27.0–33.0)
MCHC: 34.2 g/dL (ref 32.0–36.0)
MCV: 89.3 fL (ref 80.0–100.0)
MPV: 10.2 fL (ref 7.5–12.5)
Monocytes Relative: 7.8 %
Neutro Abs: 2785 cells/uL (ref 1500–7800)
Neutrophils Relative %: 54.6 %
Platelets: 176 10*3/uL (ref 140–400)
RBC: 5.33 10*6/uL (ref 4.20–5.80)
RDW: 13 % (ref 11.0–15.0)
Total Lymphocyte: 37 %
WBC: 5.1 10*3/uL (ref 3.8–10.8)

## 2021-08-05 LAB — CLOZAPINE (CLOZARIL)
Clozapine Lvl: 436 mcg/L
NorClozapine: 111 mcg/L (ref 25–400)

## 2021-08-27 ENCOUNTER — Other Ambulatory Visit: Payer: Self-pay | Admitting: Physician Assistant

## 2021-08-30 ENCOUNTER — Other Ambulatory Visit: Payer: Self-pay | Admitting: Neurology

## 2021-08-30 DIAGNOSIS — Z79899 Other long term (current) drug therapy: Secondary | ICD-10-CM

## 2021-09-01 NOTE — Progress Notes (Signed)
Stable CBC. Send to [facility].

## 2021-09-04 LAB — CBC WITH DIFFERENTIAL/PLATELET
Absolute Monocytes: 423 cells/uL (ref 200–950)
Basophils Absolute: 20 cells/uL (ref 0–200)
Basophils Relative: 0.4 %
Eosinophils Absolute: 0 cells/uL — ABNORMAL LOW (ref 15–500)
Eosinophils Relative: 0 %
HCT: 44.7 % (ref 38.5–50.0)
Hemoglobin: 15.6 g/dL (ref 13.2–17.1)
Lymphs Abs: 1785 cells/uL (ref 850–3900)
MCH: 31.5 pg (ref 27.0–33.0)
MCHC: 34.9 g/dL (ref 32.0–36.0)
MCV: 90.1 fL (ref 80.0–100.0)
MPV: 10.3 fL (ref 7.5–12.5)
Monocytes Relative: 8.3 %
Neutro Abs: 2871 cells/uL (ref 1500–7800)
Neutrophils Relative %: 56.3 %
Platelets: 163 10*3/uL (ref 140–400)
RBC: 4.96 10*6/uL (ref 4.20–5.80)
RDW: 13.2 % (ref 11.0–15.0)
Total Lymphocyte: 35 %
WBC: 5.1 10*3/uL (ref 3.8–10.8)

## 2021-09-04 LAB — CLOZAPINE (CLOZARIL)
Clozapine Lvl: 338 mcg/L
NorClozapine: 98 mcg/L (ref 25–400)

## 2021-09-04 NOTE — Progress Notes (Signed)
Send to [facility] cl[facility]apine levels.

## 2021-09-12 ENCOUNTER — Other Ambulatory Visit: Payer: Self-pay | Admitting: Physician Assistant

## 2021-09-14 ENCOUNTER — Encounter: Payer: Self-pay | Admitting: Physician Assistant

## 2021-09-22 ENCOUNTER — Other Ambulatory Visit: Payer: Self-pay

## 2021-09-22 DIAGNOSIS — Z79899 Other long term (current) drug therapy: Secondary | ICD-10-CM

## 2021-09-22 NOTE — Progress Notes (Signed)
Ordered labs

## 2021-09-24 ENCOUNTER — Other Ambulatory Visit: Payer: Self-pay | Admitting: Physician Assistant

## 2021-09-25 LAB — CBC WITH DIFFERENTIAL/PLATELET
Absolute Monocytes: 517 cells/uL (ref 200–950)
Basophils Absolute: 22 cells/uL (ref 0–200)
Basophils Relative: 0.4 %
Eosinophils Absolute: 0 cells/uL — ABNORMAL LOW (ref 15–500)
Eosinophils Relative: 0 %
HCT: 41.8 % (ref 38.5–50.0)
Hemoglobin: 14.7 g/dL (ref 13.2–17.1)
Lymphs Abs: 1887 cells/uL (ref 850–3900)
MCH: 30.8 pg (ref 27.0–33.0)
MCHC: 35.2 g/dL (ref 32.0–36.0)
MCV: 87.6 fL (ref 80.0–100.0)
MPV: 10.5 fL (ref 7.5–12.5)
Monocytes Relative: 9.4 %
Neutro Abs: 3075 cells/uL (ref 1500–7800)
Neutrophils Relative %: 55.9 %
Platelets: 163 10*3/uL (ref 140–400)
RBC: 4.77 10*6/uL (ref 4.20–5.80)
RDW: 13.3 % (ref 11.0–15.0)
Total Lymphocyte: 34.3 %
WBC: 5.5 10*3/uL (ref 3.8–10.8)

## 2021-09-25 LAB — CLOZAPINE (CLOZARIL)
Clozapine Lvl: 401 mcg/L
NorClozapine: 108 mcg/L (ref 25–400)

## 2021-09-25 NOTE — Progress Notes (Signed)
Send to [facility].

## 2021-09-25 NOTE — Progress Notes (Signed)
Send to [facility].

## 2021-10-27 ENCOUNTER — Other Ambulatory Visit: Payer: Self-pay | Admitting: Neurology

## 2021-10-27 DIAGNOSIS — Z79899 Other long term (current) drug therapy: Secondary | ICD-10-CM

## 2021-10-29 ENCOUNTER — Other Ambulatory Visit: Payer: Self-pay | Admitting: Physician Assistant

## 2021-10-30 NOTE — Progress Notes (Signed)
Send to [facility].

## 2021-10-31 LAB — CBC WITH DIFFERENTIAL/PLATELET
Absolute Monocytes: 454 cells/uL (ref 200–950)
Basophils Absolute: 22 cells/uL (ref 0–200)
Basophils Relative: 0.4 %
Eosinophils Absolute: 0 cells/uL — ABNORMAL LOW (ref 15–500)
Eosinophils Relative: 0 %
HCT: 44.8 % (ref 38.5–50.0)
Hemoglobin: 15.7 g/dL (ref 13.2–17.1)
Lymphs Abs: 1901 cells/uL (ref 850–3900)
MCH: 31.3 pg (ref 27.0–33.0)
MCHC: 35 g/dL (ref 32.0–36.0)
MCV: 89.2 fL (ref 80.0–100.0)
MPV: 10 fL (ref 7.5–12.5)
Monocytes Relative: 8.4 %
Neutro Abs: 3024 cells/uL (ref 1500–7800)
Neutrophils Relative %: 56 %
Platelets: 170 10*3/uL (ref 140–400)
RBC: 5.02 10*6/uL (ref 4.20–5.80)
RDW: 13.3 % (ref 11.0–15.0)
Total Lymphocyte: 35.2 %
WBC: 5.4 10*3/uL (ref 3.8–10.8)

## 2021-10-31 LAB — CLOZAPINE (CLOZARIL)
Clozapine Lvl: 408 mcg/L
NorClozapine: 117 mcg/L (ref 25–400)

## 2021-11-24 ENCOUNTER — Other Ambulatory Visit: Payer: Self-pay

## 2021-11-24 DIAGNOSIS — Z79899 Other long term (current) drug therapy: Secondary | ICD-10-CM

## 2021-11-27 NOTE — Progress Notes (Signed)
Send to [facility].

## 2021-11-28 LAB — CBC WITH DIFFERENTIAL/PLATELET
Absolute Monocytes: 410 cells/uL (ref 200–950)
Basophils Absolute: 20 cells/uL (ref 0–200)
Basophils Relative: 0.4 %
Eosinophils Absolute: 0 cells/uL — ABNORMAL LOW (ref 15–500)
Eosinophils Relative: 0 %
HCT: 47.3 % (ref 38.5–50.0)
Hemoglobin: 16.2 g/dL (ref 13.2–17.1)
Lymphs Abs: 2090 cells/uL (ref 850–3900)
MCH: 30.9 pg (ref 27.0–33.0)
MCHC: 34.2 g/dL (ref 32.0–36.0)
MCV: 90.1 fL (ref 80.0–100.0)
MPV: 10.4 fL (ref 7.5–12.5)
Monocytes Relative: 8.2 %
Neutro Abs: 2480 cells/uL (ref 1500–7800)
Neutrophils Relative %: 49.6 %
Platelets: 172 10*3/uL (ref 140–400)
RBC: 5.25 10*6/uL (ref 4.20–5.80)
RDW: 13.7 % (ref 11.0–15.0)
Total Lymphocyte: 41.8 %
WBC: 5 10*3/uL (ref 3.8–10.8)

## 2021-11-28 LAB — CLOZAPINE (CLOZARIL)
Clozapine Lvl: 492 mcg/L
NorClozapine: 132 mcg/L (ref 25–400)

## 2021-11-28 NOTE — Progress Notes (Signed)
Send to [facility].

## 2021-12-10 ENCOUNTER — Other Ambulatory Visit: Payer: Self-pay | Admitting: Physician Assistant

## 2021-12-15 ENCOUNTER — Ambulatory Visit (INDEPENDENT_AMBULATORY_CARE_PROVIDER_SITE_OTHER): Payer: PRIVATE HEALTH INSURANCE | Admitting: Physician Assistant

## 2021-12-15 ENCOUNTER — Encounter: Payer: Self-pay | Admitting: Physician Assistant

## 2021-12-15 VITALS — BP 106/75 | HR 113 | Ht 71.0 in | Wt 213.0 lb

## 2021-12-15 DIAGNOSIS — T887XXA Unspecified adverse effect of drug or medicament, initial encounter: Secondary | ICD-10-CM | POA: Diagnosis not present

## 2021-12-15 DIAGNOSIS — K580 Irritable bowel syndrome with diarrhea: Secondary | ICD-10-CM

## 2021-12-15 DIAGNOSIS — Z23 Encounter for immunization: Secondary | ICD-10-CM

## 2021-12-15 DIAGNOSIS — R197 Diarrhea, unspecified: Secondary | ICD-10-CM | POA: Diagnosis not present

## 2021-12-15 MED ORDER — DIPHENOXYLATE-ATROPINE 2.5-0.025 MG PO TABS: ORAL_TABLET | ORAL | 0 refills | Status: DC

## 2021-12-15 MED ORDER — RIFAXIMIN 550 MG PO TABS: 550.0000 mg | ORAL_TABLET | Freq: Three times a day (TID) | ORAL | 0 refills | Status: DC

## 2021-12-15 MED ORDER — METFORMIN HCL ER (MOD) 1000 MG PO TB24: 1000.0000 mg | ORAL_TABLET | Freq: Every day | ORAL | 2 refills | Status: DC

## 2021-12-15 NOTE — Progress Notes (Unsigned)
   Established Patient Office Visit  Subjective   Patient ID: Joshua Tyler, male    DOB: 07-Apr-1995  Age: 26 y.o. MRN: [id number]  No chief complaint on file.   HPI Pt   ROS See HPI    Objective:     There were no vitals taken for this visit. BP Readings from Last 3 Encounters:  12/15/21 106/75  05/16/21 114/75  03/06/21 118/71   Wt Readings from Last 3 Encounters:  12/15/21 213 lb (96.6 kg)  05/16/21 206 lb (93.4 kg)  03/06/21 204 lb (92.5 kg)      Physical Exam       Assessment & Plan:   [name], PA-C

## 2021-12-15 NOTE — Patient Instructions (Signed)
Stop metformin for 2 weeks  Start xifaxin bowel reset Consider starting metformin XR  Diet for Irritable Bowel Syndrome When you have irritable bowel syndrome (IBS), it is very important to follow the eating habits that are best for your condition. IBS may cause various symptoms, such as pain in the abdomen, constipation, or diarrhea. Choosing the right foods can help to ease the discomfort from these symptoms. Work with your health care provider and dietitian to find the eating plan that will help to control your symptoms. What are tips for following this plan?  Keep a food diary. This will help you identify foods that cause symptoms. Write down: What you eat and when you eat it. What symptoms you have. When symptoms occur in relation to your meals, such as "pain in abdomen 2 hours after dinner." Eat your meals slowly and in a relaxed setting. Aim to eat 5-6 small meals per day. Do not skip meals. Drink enough fluid to keep your urine pale yellow. Ask your health care provider if you should take an over-the-counter probiotic to help restore healthy bacteria in your gut (digestive tract). Probiotics are foods that contain good bacteria and yeasts. Your dietitian may have specific dietary recommendations for you based on your symptoms. Your dietitian may recommend that you: Avoid foods that cause symptoms. Talk with your dietitian about other ways to get the same nutrients that are in those problem foods. Avoid foods with gluten. Gluten is a protein that is found in rye, wheat, and barley. Eat more foods that contain soluble fiber. Examples of foods with high soluble fiber include oats, seeds, and certain fruits and vegetables. Take a fiber supplement if told by your dietitian. Reduce or avoid certain foods called FODMAPs. These are foods that contain sugars that are hard for some people to digest. Ask your health care provider which foods to avoid. What foods should I avoid? The following are  some foods and drinks that may make your symptoms worse: Fatty foods, such as french fries. Foods that contain gluten, such as pasta and cereal. Dairy products, such as milk, cheese, and ice cream. Spicy foods. Alcohol. Products with caffeine, such as coffee, tea, or chocolate. Carbonated drinks, such as soda. Foods that are high in FODMAPs. These include certain fruits and vegetables. Products with sweeteners such as honey, high fructose corn syrup, sorbitol, and mannitol. The items listed above may not be a complete list of foods and beverages you should avoid. Contact a dietitian for more information. What foods are good sources of fiber? Your health care provider or dietitian may recommend that you eat more foods that contain fiber. Fiber can help to reduce constipation and other IBS symptoms. Add foods with fiber to your diet a little at a time so your body can get used to them. Too much fiber at one time might cause gas and swelling of your abdomen. The following are some foods that are good sources of fiber: Berries, such as raspberries, strawberries, and blueberries. Tomatoes. Carrots. Brown rice. Oats. Seeds, such as chia and pumpkin seeds. The items listed above may not be a complete list of recommended sources of fiber. Contact your dietitian for more options. Where to find more information International Foundation for Functional Gastrointestinal Disorders: aboutibs.[employer] of Diabetes and Digestive and Kidney Diseases: StageSync.si Summary When you have irritable bowel syndrome (IBS), it is very important to follow the eating habits that are best for your condition. IBS may cause various symptoms, such as pain  in the abdomen, constipation, or diarrhea. Choosing the right foods can help to ease the discomfort that comes from symptoms. Your health care provider or dietitian may recommend that you eat more foods that contain fiber. Keep a food diary. This will  help you identify foods that cause symptoms. This information is not intended to replace advice given to you by your health care provider. Make sure you discuss any questions you have with your health care provider. Document Revised: 02/14/2021 Document Reviewed: 02/14/2021 Elsevier Patient Education  [place].

## 2021-12-18 ENCOUNTER — Encounter: Payer: Self-pay | Admitting: Physician Assistant

## 2021-12-28 ENCOUNTER — Other Ambulatory Visit: Payer: Self-pay | Admitting: Neurology

## 2021-12-28 DIAGNOSIS — Z79899 Other long term (current) drug therapy: Secondary | ICD-10-CM

## 2021-12-29 LAB — CBC WITH DIFFERENTIAL/PLATELET
Absolute Monocytes: 402 cells/uL (ref 200–950)
Basophils Absolute: 18 cells/uL (ref 0–200)
Basophils Relative: 0.3 %
Eosinophils Absolute: 0 cells/uL — ABNORMAL LOW (ref 15–500)
Eosinophils Relative: 0 %
HCT: 43.1 % (ref 38.5–50.0)
Hemoglobin: 15 g/dL (ref 13.2–17.1)
Lymphs Abs: 1842 cells/uL (ref 850–3900)
MCH: 31.2 pg (ref 27.0–33.0)
MCHC: 34.8 g/dL (ref 32.0–36.0)
MCV: 89.6 fL (ref 80.0–100.0)
MPV: 11.2 fL (ref 7.5–12.5)
Monocytes Relative: 6.7 %
Neutro Abs: 3738 cells/uL (ref 1500–7800)
Neutrophils Relative %: 62.3 %
Platelets: 160 10*3/uL (ref 140–400)
RBC: 4.81 10*6/uL (ref 4.20–5.80)
RDW: 13.1 % (ref 11.0–15.0)
Total Lymphocyte: 30.7 %
WBC: 6 10*3/uL (ref 3.8–10.8)

## 2022-01-01 NOTE — Progress Notes (Signed)
Fax to [facility].

## 2022-01-27 LAB — CBC WITH DIFFERENTIAL/PLATELET
Absolute Monocytes: 435 cells/uL (ref 200–950)
Basophils Absolute: 28 cells/uL (ref 0–200)
Basophils Relative: 0.5 %
Eosinophils Absolute: 88 cells/uL (ref 15–500)
Eosinophils Relative: 1.6 %
HCT: 44.3 % (ref 38.5–50.0)
Hemoglobin: 16.1 g/dL (ref 13.2–17.1)
Lymphs Abs: 2338 cells/uL (ref 850–3900)
MCH: 31.9 pg (ref 27.0–33.0)
MCHC: 36.3 g/dL — ABNORMAL HIGH (ref 32.0–36.0)
MCV: 87.9 fL (ref 80.0–100.0)
MPV: 10.6 fL (ref 7.5–12.5)
Monocytes Relative: 7.9 %
Neutro Abs: 2613 cells/uL (ref 1500–7800)
Neutrophils Relative %: 47.5 %
Platelets: 185 10*3/uL (ref 140–400)
RBC: 5.04 10*6/uL (ref 4.20–5.80)
RDW: 13.2 % (ref 11.0–15.0)
Total Lymphocyte: 42.5 %
WBC: 5.5 10*3/uL (ref 3.8–10.8)

## 2022-01-28 ENCOUNTER — Other Ambulatory Visit: Payer: Self-pay | Admitting: Physician Assistant

## 2022-01-29 NOTE — Progress Notes (Signed)
Send to [facility].

## 2022-02-22 ENCOUNTER — Other Ambulatory Visit: Payer: Self-pay

## 2022-02-22 ENCOUNTER — Encounter: Payer: Self-pay | Admitting: Physician Assistant

## 2022-02-22 DIAGNOSIS — Z79899 Other long term (current) drug therapy: Secondary | ICD-10-CM

## 2022-02-26 ENCOUNTER — Encounter: Payer: Self-pay | Admitting: Physician Assistant

## 2022-02-27 ENCOUNTER — Other Ambulatory Visit: Payer: Self-pay | Admitting: Physician Assistant

## 2022-02-27 LAB — CBC WITH DIFFERENTIAL/PLATELET
Absolute Monocytes: 353 cells/uL (ref 200–950)
Basophils Absolute: 28 cells/uL (ref 0–200)
Basophils Relative: 0.6 %
Eosinophils Absolute: 0 cells/uL — ABNORMAL LOW (ref 15–500)
Eosinophils Relative: 0 %
HCT: 44.3 % (ref 38.5–50.0)
Hemoglobin: 15.5 g/dL (ref 13.2–17.1)
Lymphs Abs: 2026 cells/uL (ref 850–3900)
MCH: 30.9 pg (ref 27.0–33.0)
MCHC: 35 g/dL (ref 32.0–36.0)
MCV: 88.4 fL (ref 80.0–100.0)
MPV: 10.7 fL (ref 7.5–12.5)
Monocytes Relative: 7.5 %
Neutro Abs: 2294 cells/uL (ref 1500–7800)
Neutrophils Relative %: 48.8 %
Platelets: 173 10*3/uL (ref 140–400)
RBC: 5.01 10*6/uL (ref 4.20–5.80)
RDW: 13.3 % (ref 11.0–15.0)
Total Lymphocyte: 43.1 %
WBC: 4.7 10*3/uL (ref 3.8–10.8)

## 2022-02-27 NOTE — Progress Notes (Signed)
Send to [facility] for records.

## 2022-03-05 MED ORDER — METFORMIN HCL ER 500 MG PO TB24: 500.0000 mg | ORAL_TABLET | Freq: Every day | ORAL | 5 refills | Status: DC

## 2022-03-05 NOTE — Addendum Note (Signed)
Addended by: Jomarie Longs on: 03/05/2022 08:23 AM   Modules accepted: Orders

## 2022-03-20 ENCOUNTER — Encounter: Payer: Self-pay | Admitting: Physician Assistant

## 2022-04-12 ENCOUNTER — Other Ambulatory Visit: Payer: Self-pay | Admitting: *Deleted

## 2022-04-12 DIAGNOSIS — Z79899 Other long term (current) drug therapy: Secondary | ICD-10-CM

## 2022-04-13 NOTE — Progress Notes (Signed)
Stable CBC. Send to [facility].

## 2022-04-15 LAB — CBC WITH DIFFERENTIAL/PLATELET
Absolute Monocytes: 378 cells/uL (ref 200–950)
Basophils Absolute: 32 cells/uL (ref 0–200)
Basophils Relative: 0.6 %
Eosinophils Absolute: 0 cells/uL — ABNORMAL LOW (ref 15–500)
Eosinophils Relative: 0 %
HCT: 46.6 % (ref 38.5–50.0)
Hemoglobin: 16.5 g/dL (ref 13.2–17.1)
Lymphs Abs: 2052 cells/uL (ref 850–3900)
MCH: 31.1 pg (ref 27.0–33.0)
MCHC: 35.4 g/dL (ref 32.0–36.0)
MCV: 87.9 fL (ref 80.0–100.0)
MPV: 11 fL (ref 7.5–12.5)
Monocytes Relative: 7 %
Neutro Abs: 2938 cells/uL (ref 1500–7800)
Neutrophils Relative %: 54.4 %
Platelets: 171 10*3/uL (ref 140–400)
RBC: 5.3 10*6/uL (ref 4.20–5.80)
RDW: 13.1 % (ref 11.0–15.0)
Total Lymphocyte: 38 %
WBC: 5.4 10*3/uL (ref 3.8–10.8)

## 2022-04-15 LAB — CLOZAPINE (CLOZARIL)
Clozapine Lvl: 591 mcg/L
NorClozapine: 147 mcg/L (ref 25–400)

## 2022-04-25 ENCOUNTER — Other Ambulatory Visit: Payer: Self-pay | Admitting: Physician Assistant

## 2022-05-02 ENCOUNTER — Encounter: Payer: Self-pay | Admitting: Physician Assistant

## 2022-05-10 MED ORDER — METFORMIN HCL ER 500 MG PO TB24: ORAL_TABLET | ORAL | 2 refills | Status: DC

## 2022-05-10 NOTE — Addendum Note (Signed)
Addended by: Donella Stade on: 05/10/2022 10:56 AM   Modules accepted: Orders

## 2022-05-14 ENCOUNTER — Encounter: Payer: Self-pay | Admitting: Physician Assistant

## 2022-05-16 LAB — CBC WITH DIFFERENTIAL/PLATELET
Absolute Monocytes: 497 cells/uL (ref 200–950)
Basophils Absolute: 22 cells/uL (ref 0–200)
Basophils Relative: 0.3 %
Eosinophils Absolute: 173 cells/uL (ref 15–500)
Eosinophils Relative: 2.4 %
HCT: 46.1 % (ref 38.5–50.0)
Hemoglobin: 15.9 g/dL (ref 13.2–17.1)
Lymphs Abs: 2182 cells/uL (ref 850–3900)
MCH: 30.6 pg (ref 27.0–33.0)
MCHC: 34.5 g/dL (ref 32.0–36.0)
MCV: 88.8 fL (ref 80.0–100.0)
MPV: 10.7 fL (ref 7.5–12.5)
Monocytes Relative: 6.9 %
Neutro Abs: 4327 cells/uL (ref 1500–7800)
Neutrophils Relative %: 60.1 %
Platelets: 158 10*3/uL (ref 140–400)
RBC: 5.19 10*6/uL (ref 4.20–5.80)
RDW: 13.5 % (ref 11.0–15.0)
Total Lymphocyte: 30.3 %
WBC: 7.2 10*3/uL (ref 3.8–10.8)

## 2022-05-18 NOTE — Progress Notes (Signed)
Stable. Ok to send to [facility].

## 2022-06-05 ENCOUNTER — Ambulatory Visit (INDEPENDENT_AMBULATORY_CARE_PROVIDER_SITE_OTHER): Payer: PRIVATE HEALTH INSURANCE | Admitting: Physician Assistant

## 2022-06-05 ENCOUNTER — Encounter: Payer: Self-pay | Admitting: Physician Assistant

## 2022-06-05 VITALS — BP 120/78 | HR 96 | Ht 71.0 in | Wt 226.0 lb

## 2022-06-05 DIAGNOSIS — J301 Allergic rhinitis due to pollen: Secondary | ICD-10-CM

## 2022-06-05 DIAGNOSIS — Z0000 Encounter for general adult medical examination without abnormal findings: Secondary | ICD-10-CM

## 2022-06-05 DIAGNOSIS — I951 Orthostatic hypotension: Secondary | ICD-10-CM | POA: Diagnosis not present

## 2022-06-05 DIAGNOSIS — E7800 Pure hypercholesterolemia, unspecified: Secondary | ICD-10-CM | POA: Diagnosis not present

## 2022-06-05 DIAGNOSIS — G90A Postural orthostatic tachycardia syndrome (POTS): Secondary | ICD-10-CM

## 2022-06-05 DIAGNOSIS — K2100 Gastro-esophageal reflux disease with esophagitis, without bleeding: Secondary | ICD-10-CM

## 2022-06-05 DIAGNOSIS — F250 Schizoaffective disorder, bipolar type: Secondary | ICD-10-CM

## 2022-06-05 DIAGNOSIS — Z79899 Other long term (current) drug therapy: Secondary | ICD-10-CM | POA: Diagnosis not present

## 2022-06-05 DIAGNOSIS — K580 Irritable bowel syndrome with diarrhea: Secondary | ICD-10-CM

## 2022-06-05 DIAGNOSIS — G44201 Tension-type headache, unspecified, intractable: Secondary | ICD-10-CM

## 2022-06-05 DIAGNOSIS — Z1159 Encounter for screening for other viral diseases: Secondary | ICD-10-CM

## 2022-06-05 MED ORDER — RIZATRIPTAN BENZOATE 10 MG PO TABS: ORAL_TABLET | ORAL | 3 refills | Status: DC

## 2022-06-05 MED ORDER — METOPROLOL SUCCINATE ER 25 MG PO TB24: 25.0000 mg | ORAL_TABLET | Freq: Every day | ORAL | 3 refills | Status: DC

## 2022-06-05 MED ORDER — OMEPRAZOLE 40 MG PO CPDR: 40.0000 mg | DELAYED_RELEASE_CAPSULE | Freq: Every day | ORAL | 3 refills | Status: AC

## 2022-06-05 MED ORDER — FLUTICASONE PROPIONATE 50 MCG/ACT NA SUSP: NASAL | 3 refills | Status: AC

## 2022-06-05 MED ORDER — DICYCLOMINE HCL 20 MG PO TABS: 20.0000 mg | ORAL_TABLET | Freq: Three times a day (TID) | ORAL | 3 refills | Status: DC | PRN

## 2022-06-05 MED ORDER — MIDODRINE HCL 2.5 MG PO TABS: 2.5000 mg | ORAL_TABLET | Freq: Three times a day (TID) | ORAL | 3 refills | Status: DC

## 2022-06-05 NOTE — Progress Notes (Signed)
Complete physical exam  Patient: Joshua Tyler   DOB: Jun 18, 1995   27 y.o. Male  MRN: [id number]  Subjective:    Chief Complaint  Patient presents with   Annual Exam    [name] is a 27 y.o. male who presents today for a complete physical exam. He reports consuming a general diet.  Him and his wife do try to walk regularly.   He generally feels fairly well. He reports sleeping well. He does not have additional problems to discuss today.      Most recent depression screenings:    03/17/2019    1:53 PM 11/07/2018    1:05 PM  PHQ 2/9 Scores  PHQ - 2 Score 2 2  PHQ- 9 Score 9 7    Vision:Within last year, Dental: No current dental problems and Receives regular dental care, and STD: The patient denies history of sexually transmitted disease.  Patient Active Problem List   Diagnosis Date Noted   Epigastric pain 01/12/2021   Left testicular pain 01/12/2021   Cough 08/05/2020   Acute pain of left shoulder 08/05/2020   Near syncope 01/29/2020   POTS (postural orthostatic tachycardia syndrome) 01/29/2020   tachycardia 01/29/2020   Orthostatic hypotension 01/29/2020   Nasal obstruction 10/12/2019   Deviated septum 06/03/2019   Left medial tibial stress syndrome 11/20/2018   Bone thickening 11/07/2018   Lower leg mass, left 11/07/2018   Pain of left fibula 11/07/2018   Abnormality of fibula 11/05/2018   PFO (patent foramen ovale) 10/28/2018   Right axis deviation 10/28/2018   Acute intractable headache 10/28/2018   Sinus tachycardia by electrocardiogram 10/27/2018   GERD (gastroesophageal reflux disease) 07/10/2018   Irritable bowel syndrome 07/10/2018   Palpitations 05/01/2018   Generalized abdominal pain 04/25/2018   Status post cholecystectomy 09/30/2017   Spleen enlarged 07/30/2017   Schizoaffective disorder ([place]) 07/21/2017   Gastroesophageal reflux disease with esophagitis 07/21/2017   Anxiety 05/23/2017   OCD (obsessive compulsive disorder) 05/23/2017    Abdominal cramping 09/17/2016   Hypersomnolence 09/17/2016   Seasonal allergic rhinitis 12/07/2015   Chronic fatigue 11/17/2015   ANA positive 10/26/2015   Vitamin D deficiency 10/26/2015   Arthralgia 10/23/2015   High risk medication use 09/26/2015   Schizoaffective disorder, bipolar type ([place]) 10/10/2012   Major depressive disorder, single episode 03/02/2011   Past Medical History:  Diagnosis Date   Abdominal cramping 09/17/2016   Abnormality of fibula 11/05/2018   Diffuse cortical thickening   Acute intractable headache 10/28/2018   ANA positive 10/26/2015   Anxiety 05/23/2017   Arthralgia 10/23/2015   Bipolar 1 disorder (HCC)    Bone thickening 11/07/2018   Chronic fatigue 11/17/2015   Gastroesophageal reflux disease with esophagitis 07/21/2017   Generalized abdominal pain 04/25/2018   Formatting of this note might be different from the original. 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 Formatting of this note might be different from the original. Added automatically from request for surgery [id number] Formatting of this note might be different from the original. Added automatically from request for surgery [id number]   Near syncope 01/29/2020   OCD (obsessive  compulsive disorder) 05/23/2017   Orthostatic hypotension 01/29/2020   Pain of left fibula 11/07/2018   Palpitations 05/01/2018   PFO (patent foramen ovale) 10/28/2018   POTS (postural orthostatic tachycardia syndrome) 01/29/2020   Right axis deviation 10/28/2018   Schizoaffective disorder ([place])    Schizoaffective disorder ([place]) 07/21/2017   Schizoaffective  disorder, bipolar type ([place]) 10/10/2012   Sinus tachycardia by electrocardiogram 10/27/2018   Spleen enlarged 07/30/2017   13cm on u/s 07/2017.    Status post cholecystectomy 09/30/2017   tachycardia 01/29/2020   Vitamin D deficiency 10/26/2015   Formatting of this note might be different from the original. (low)   Past Surgical History:  Procedure Laterality Date   CHOLECYSTECTOMY  2019   WISDOM TOOTH EXTRACTION     Family History  Problem Relation Age of Onset   Heart disease Father    Heart attack Paternal Grandfather    Cancer Paternal Grandfather        colon and prostate   No Known Allergies    Patient Care Team: Lavada Mesi as PCP - General (Family Medicine) Berniece Salines, DO as PCP - Cardiology (Cardiology) Nat Christen, MD as Referring Physician (Psychiatry) Silverio Decamp, MD as Consulting Physician (Sports Medicine) Rayvon Char, Lonia Farber, MD as Consulting Physician (Psychiatry) Lovena Neighbours, MD as Referring Physician (Psychiatry)   Outpatient Medications Prior to Visit  Medication Sig   cloZAPine (CLOZARIL) 100 MG tablet Take 125 mg by mouth daily.   divalproex (DEPAKOTE) 250 MG DR tablet 1 in the AM, 2 at bedtime   fluvoxaMINE (LUVOX) 100 MG tablet Take 150 mg by mouth daily.   loratadine (CLARITIN) 10 MG tablet Take 10 mg by mouth daily.   metFORMIN (GLUCOPHAGE-XR) 500 MG 24 hr tablet Take 4 tablets daily in the morning.   mirtazapine (REMERON) 30 MG tablet Take 30 mg by mouth at bedtime.   [DISCONTINUED] dicyclomine (BENTYL) 20 MG tablet Take 1 tablet (20 mg total) by mouth 3 (three) times daily as needed for spasms. NO REFILLS. OVERDUE FOR A VISIT WITH PCP.   [DISCONTINUED] fluticasone (FLONASE) 50 MCG/ACT nasal spray SPRAY 2 SPRAYS INTO EACH NOSTRIL EVERY DAY   [DISCONTINUED] metoprolol succinate (TOPROL-XL) 25 MG 24 hr tablet Take 1 tablet (25 mg total) by mouth daily. NO REFILLS. OVERDUE FOR A VISIT WITH PCP.   [DISCONTINUED] midodrine  (PROAMATINE) 2.5 MG tablet Take 1 tablet (2.5 mg total) by mouth 3 (three) times daily with meals.   [DISCONTINUED] omeprazole (PRILOSEC) 40 MG capsule Take 40 mg by mouth daily.   [DISCONTINUED] rizatriptan (MAXALT) 10 MG tablet TAKE 1 TABLET BY MOUTH AS NEEDED FOR MIGRAINE. MAY REPEAT IN 2 HOURS IF NEEDED   [DISCONTINUED] diphenoxylate-atropine (LOMOTIL) 2.5-0.025 MG tablet One to 2 tablets by mouth 4 times a day as needed for diarrhea.   [DISCONTINUED] mirtazapine (REMERON) 15 MG tablet Take 15 mg by mouth at bedtime.   No facility-administered medications prior to visit.    Review of Systems  All other systems reviewed and are negative.         Objective:     BP 120/78   Pulse (!) 120   Ht 5\' 11"  (1.803 m)   Wt 226 lb (102.5 kg)   SpO2 98%   BMI 31.52 kg/m  BP Readings from Last 3 Encounters:  06/05/22 120/78  12/15/21 106/75  05/16/21 114/75   Wt Readings from Last 3 Encounters:  06/05/22 226 lb (102.5 kg)  12/15/21 213 lb (96.6 kg)  05/16/21 206 lb (93.4 kg)      Physical Exam  BP 120/78   Pulse (!) 120   Ht 5\' 11"  (1.803 m)   Wt 226 lb (102.5 kg)   SpO2 98%   BMI 31.52 kg/m   General Appearance:    Alert, cooperative, obese, no distress, appears stated age  Head:    Normocephalic, without obvious abnormality, atraumatic  Eyes:    PERRL, conjunctiva/corneas clear, EOM's intact, fundi    benign, both eyes       Ears:    Normal TM's and external ear canals, both ears  Nose:   Nares normal, septum midline, mucosa normal, no drainage    or sinus tenderness  Throat:   Lips, mucosa, and tongue normal; teeth and gums normal  Neck:   Supple, symmetrical, trachea midline, no adenopathy;       thyroid:  No enlargement/tenderness/nodules; no carotid   bruit or JVD  Back:     Symmetric, no curvature, ROM normal, no CVA tenderness  Lungs:     Clear to auscultation bilaterally, respirations unlabored  Chest wall:    No tenderness or deformity  Heart:    Regular  rate and rhythm, S1 and S2 normal, no murmur, rub   or gallop  Abdomen:     Soft, non-tender, bowel sounds active all four quadrants,    no masses, no organomegaly        Extremities:   Extremities normal, atraumatic, no cyanosis or edema  Pulses:   2+ and symmetric all extremities  Skin:   Skin color, texture, turgor normal, no rashes or lesions  Lymph nodes:   Cervical, supraclavicular, and axillary nodes normal  Neurologic:   CNII-XII intact. Normal strength, sensation and reflexes      throughout      Assessment & Plan:    Routine Health Maintenance and Physical Exam  Immunization History  Administered Date(s) Administered   DTaP 03/04/1995, 05/01/1995, 07/01/1995, 06/30/1996, 12/07/1999, 12/08/2003   HIB (PRP-OMP) 03/04/1995, 05/01/1995, 07/01/1995, 04/08/1996   HIB (PRP-T) 03/04/1995, 05/01/1995, 07/01/1995, 04/08/1996   HIB, Unspecified 03/04/1995, 05/01/1995, 07/01/1995, 04/08/1996   Hepatitis B, PED/ADOLESCENT 03/31/94, 03/04/1995, 07/01/1995   IPV 03/04/1995, 05/01/1995, 06/30/1996, 12/07/1999, 12/08/2003   Influenza Split 03/07/2011   Influenza,inj,Quad PF,6+ Mos 12/17/2017, 11/20/2018, 01/27/2020, 01/12/2021, 12/15/2021   Influenza-Unspecified 03/07/2011, 03/31/2012   MMR 12/31/1995, 12/07/1999, 12/08/2003   Meningococcal Conjugate 10/20/2010   PFIZER Comirnaty(Gray Top)Covid-19 Tri-Sucrose Vaccine 06/12/2019, 07/13/2019   PFIZER(Purple Top)SARS-COV-2 Vaccination 06/12/2019, 07/13/2019   PPD Test 03/31/2012   Tdap 10/31/2006, 05/18/2014, 12/05/2017   Varicella 12/31/1995    Health Maintenance  Topic Date Due   HIV Screening  Never done   Hepatitis C Screening  Never done   COVID-19 Vaccine (5 - 2023-24 season) 06/21/2022 (Originally 11/17/2021)   DTaP/Tdap/Td (9 - Td or Tdap) 12/06/2027   INFLUENZA VACCINE  Completed   HPV VACCINES  Aged Out    Discussed health benefits of physical activity, and encouraged him to engage in regular exercise appropriate for  his age and condition.  Marland KitchenOvid Curd was seen today for annual exam.  Diagnoses and all orders for this visit:  Routine physical examination -     Lipid Panel w/reflex Direct LDL -     COMPLETE METABOLIC PANEL WITH GFR -     Hemoglobin A1c -     TSH  Medication management -     Lipid Panel w/reflex Direct LDL -     COMPLETE METABOLIC PANEL WITH GFR -  Hemoglobin A1c -     TSH  Elevated LDL cholesterol level -     Lipid Panel w/reflex Direct LDL  Orthostatic hypotension -     COMPLETE METABOLIC PANEL WITH GFR -     midodrine (PROAMATINE) 2.5 MG tablet; Take 1 tablet (2.5 mg total) by mouth 3 (three) times daily with meals. -     metoprolol succinate (TOPROL-XL) 25 MG 24 hr tablet; Take 1 tablet (25 mg total) by mouth daily.  POTS (postural orthostatic tachycardia syndrome) -     COMPLETE METABOLIC PANEL WITH GFR -     midodrine (PROAMATINE) 2.5 MG tablet; Take 1 tablet (2.5 mg total) by mouth 3 (three) times daily with meals. -     metoprolol succinate (TOPROL-XL) 25 MG 24 hr tablet; Take 1 tablet (25 mg total) by mouth daily.  Schizoaffective disorder, bipolar type (HCC)  Seasonal allergic rhinitis due to pollen -     fluticasone (FLONASE) 50 MCG/ACT nasal spray; SPRAY 2 SPRAYS INTO EACH NOSTRIL EVERY DAY  Gastroesophageal reflux disease with esophagitis, unspecified whether hemorrhage -     omeprazole (PRILOSEC) 40 MG capsule; Take 1 capsule (40 mg total) by mouth daily.  Irritable bowel syndrome with diarrhea -     dicyclomine (BENTYL) 20 MG tablet; Take 1 tablet (20 mg total) by mouth 3 (three) times daily as needed for spasms.  Acute intractable tension-type headache -     rizatriptan (MAXALT) 10 MG tablet; TAKE 1 TABLET BY MOUTH AS NEEDED FOR MIGRAINE. MAY REPEAT IN 2 HOURS IF NEEDED   .Marland KitchenStart a regular exercise program and make sure you are eating a healthy diet Try to eat 4 servings of dairy a day or take a calcium supplement (500mg  twice a day). Declined covid  vaccine Flu shot UTD Fasting labs ordered Medications refilled   PHQ-9 not to goal but being managed by [facility].  No red flags.    Return in about 1 year (around 06/05/2023).     [name], PA-C

## 2022-06-05 NOTE — Patient Instructions (Signed)
Health Maintenance, Male Adopting a healthy lifestyle and getting preventive care are important in promoting health and wellness. Ask your health care provider about: The right schedule for you to have regular tests and exams. Things you can do on your own to prevent diseases and keep yourself healthy. What should I know about diet, weight, and exercise? Eat a healthy diet  Eat a diet that includes plenty of vegetables, fruits, low-fat dairy products, and lean protein. Do not eat a lot of foods that are high in solid fats, added sugars, or sodium. Maintain a healthy weight Body mass index (BMI) is a measurement that can be used to identify possible weight problems. It estimates body fat based on height and weight. Your health care provider can help determine your BMI and help you achieve or maintain a healthy weight. Get regular exercise Get regular exercise. This is one of the most important things you can do for your health. Most adults should: Exercise for at least 150 minutes each week. The exercise should increase your heart rate and make you sweat (moderate-intensity exercise). Do strengthening exercises at least twice a week. This is in addition to the moderate-intensity exercise. Spend less time sitting. Even light physical activity can be beneficial. Watch cholesterol and blood lipids Have your blood tested for lipids and cholesterol at 28 years of age, then have this test every 5 years. You may need to have your cholesterol levels checked more often if: Your lipid or cholesterol levels are high. You are older than 28 years of age. You are at high risk for heart disease. What should I know about cancer screening? Many types of cancers can be detected early and may often be prevented. Depending on your health history and family history, you may need to have cancer screening at various ages. This may include screening for: Colorectal cancer. Prostate cancer. Skin cancer. Lung  cancer. What should I know about heart disease, diabetes, and high blood pressure? Blood pressure and heart disease High blood pressure causes heart disease and increases the risk of stroke. This is more likely to develop in people who have high blood pressure readings or are overweight. Talk with your health care provider about your target blood pressure readings. Have your blood pressure checked: Every 3-5 years if you are 18-39 years of age. Every year if you are 40 years old or older. If you are between the ages of 65 and 75 and are a current or former smoker, ask your health care provider if you should have a one-time screening for abdominal aortic aneurysm (AAA). Diabetes Have regular diabetes screenings. This checks your fasting blood sugar level. Have the screening done: Once every three years after age 45 if you are at a normal weight and have a low risk for diabetes. More often and at a younger age if you are overweight or have a high risk for diabetes. What should I know about preventing infection? Hepatitis B If you have a higher risk for hepatitis B, you should be screened for this virus. Talk with your health care provider to find out if you are at risk for hepatitis B infection. Hepatitis C Blood testing is recommended for: Everyone born from 1945 through 1965. Anyone with known risk factors for hepatitis C. Sexually transmitted infections (STIs) You should be screened each year for STIs, including gonorrhea and chlamydia, if: You are sexually active and are younger than 28 years of age. You are older than 28 years of age and your   health care provider tells you that you are at risk for this type of infection. Your sexual activity has changed since you were last screened, and you are at increased risk for chlamydia or gonorrhea. Ask your health care provider if you are at risk. Ask your health care provider about whether you are at high risk for HIV. Your health care provider  may recommend a prescription medicine to help prevent HIV infection. If you choose to take medicine to prevent HIV, you should first get tested for HIV. You should then be tested every 3 months for as long as you are taking the medicine. Follow these instructions at home: Alcohol use Do not drink alcohol if your health care provider tells you not to drink. If you drink alcohol: Limit how much you have to 0-2 drinks a day. Know how much alcohol is in your drink. In the [place] one drink equals one 12 oz bottle of beer (355 mL), one 5 oz glass of wine (148 mL), or one 1 oz glass of hard liquor (44 mL). Lifestyle Do not use any products that contain nicotine or tobacco. These products include cigarettes, chewing tobacco, and vaping devices, such as e-cigarettes. If you need help quitting, ask your health care provider. Do not use street drugs. Do not share needles. Ask your health care provider for help if you need support or information about quitting drugs. General instructions Schedule regular health, dental, and eye exams. Stay current with your vaccines. Tell your health care provider if: You often feel depressed. You have ever been abused or do not feel safe at home. Summary Adopting a healthy lifestyle and getting preventive care are important in promoting health and wellness. Follow your health care provider's instructions about healthy diet, exercising, and getting tested or screened for diseases. Follow your health care provider's instructions on monitoring your cholesterol and blood pressure. This information is not intended to replace advice given to you by your health care provider. Make sure you discuss any questions you have with your health care provider. Document Revised: 07/25/2020 Document Reviewed: 07/25/2020 Elsevier Patient Education  2023 Elsevier Inc.  

## 2022-06-27 ENCOUNTER — Other Ambulatory Visit: Payer: Self-pay

## 2022-06-27 ENCOUNTER — Encounter: Payer: Self-pay | Admitting: Physician Assistant

## 2022-06-27 DIAGNOSIS — Z79899 Other long term (current) drug therapy: Secondary | ICD-10-CM

## 2022-06-28 ENCOUNTER — Ambulatory Visit: Payer: PRIVATE HEALTH INSURANCE | Admitting: Family Medicine

## 2022-06-28 ENCOUNTER — Encounter: Payer: Self-pay | Admitting: Family Medicine

## 2022-06-28 VITALS — BP 124/86 | HR 118 | Ht 71.0 in | Wt 225.0 lb

## 2022-06-28 DIAGNOSIS — R051 Acute cough: Secondary | ICD-10-CM | POA: Diagnosis not present

## 2022-06-28 DIAGNOSIS — J029 Acute pharyngitis, unspecified: Secondary | ICD-10-CM | POA: Diagnosis not present

## 2022-06-28 LAB — CBC WITH DIFFERENTIAL/PLATELET
Absolute Monocytes: 632 cells/uL (ref 200–950)
Basophils Relative: 0.4 %
Eosinophils Absolute: 0 cells/uL — ABNORMAL LOW (ref 15–500)
Eosinophils Relative: 0 %
Lymphs Abs: 1612 cells/uL (ref 850–3900)
MCHC: 34.6 g/dL (ref 32.0–36.0)
Neutro Abs: 5625 cells/uL (ref 1500–7800)
Neutrophils Relative %: 71.2 %
RDW: 13.5 % (ref 11.0–15.0)
WBC: 7.9 10*3/uL (ref 3.8–10.8)

## 2022-06-28 NOTE — Assessment & Plan Note (Signed)
Testing for COVID, flu are negative.  Symptoms are consistent with viral etiology.  Recommend continued supportive care with increase fluids, continuation of Mucinex.  Delsym as needed for cough.  He does have Flonase at home and he will add back on.  Instructed to contact clinic if having new or worsening symptoms.

## 2022-06-28 NOTE — Progress Notes (Signed)
Joshua Tyler - 27 y.o. male MRN [id number]  Date of birth: 11/17/1995  Subjective Chief Complaint  Patient presents with   URI    HPI Joshua Tyler is a 27 year old male here today with complaint of cough, chest congestion, body aches and fatigue.  He has not had fever.  He denies chest pain, shortness of breath, wheezing, GI symptoms.  He has tried Mucinex with some relief.  He has been drinking plenty of fluids.   ROS:  A comprehensive ROS was completed and negative except as noted per HPI  No Known Allergies  Past Medical History:  Diagnosis Date   Abdominal cramping 09/17/2016   Abnormality of fibula 11/05/2018   Diffuse cortical thickening   Acute intractable headache 10/28/2018   ANA positive 10/26/2015   Anxiety 05/23/2017   Arthralgia 10/23/2015   Bipolar 1 disorder    Bone thickening 11/07/2018   Chronic fatigue 11/17/2015   Gastroesophageal reflux disease with esophagitis 07/21/2017   Generalized abdominal pain 04/25/2018   Formatting of this note might be different from the original. 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 Formatting of this note might be different from the original. Added automatically from request for surgery [id number] Formatting of this note might be different from the original. Added automatically from request for surgery [id number]   Near syncope 01/29/2020   OCD (obsessive compulsive disorder) 05/23/2017   Orthostatic hypotension 01/29/2020   Pain of left fibula 11/07/2018   Palpitations 05/01/2018   PFO (patent foramen ovale) 10/28/2018   POTS (postural orthostatic tachycardia  syndrome) 01/29/2020   Right axis deviation 10/28/2018   Schizoaffective disorder    Schizoaffective disorder 07/21/2017   Schizoaffective disorder, bipolar type 10/10/2012   Sinus tachycardia by electrocardiogram 10/27/2018   Spleen enlarged 07/30/2017   13cm on u/s 07/2017.    Status post cholecystectomy 09/30/2017   tachycardia 01/29/2020   Vitamin D deficiency 10/26/2015   Formatting of this note might be different from the original. (low)    Past Surgical History:  Procedure Laterality Date   CHOLECYSTECTOMY  2019   WISDOM TOOTH EXTRACTION      Social History   Socioeconomic History   Marital status: Married    Spouse name: Not on file   Number of children: Not on file   Years of education: Not on file   Highest education level: Not on file  Occupational History   Not on file  Tobacco Use   Smoking status: Former    Types: Cigarettes    Quit date: 06/25/2012    Years since quitting: 10.0   Smokeless tobacco: Never  Vaping Use   Vaping Use: Never used  Substance and Sexual Activity   Alcohol use: Never   Drug use: Not Currently    Types: Marijuana   Sexual activity: Yes    Birth control/protection: Condom, Pill  Other Topics Concern   Not on file  Social History Narrative   Not on file   Social Determinants of Health   Financial Resource Strain: Not on file  Food Insecurity: Not on file  Transportation Needs: Not on file  Physical  Activity: Not on file  Stress: Not on file  Social Connections: Not on file    Family History  Problem Relation Age of Onset   Heart disease Father    Heart attack Paternal Grandfather    Cancer Paternal Grandfather        colon and prostate    Health Maintenance  Topic Date Due   Hepatitis C Screening  Never done   COVID-19 Vaccine (5 - 2023-24 season) 11/17/2021   HIV Screening  06/05/2023 (Originally 12/26/2009)   INFLUENZA VACCINE  10/18/2022   DTaP/Tdap/Td (9 - Td or Tdap) 12/06/2027   HPV VACCINES  Aged Out      ----------------------------------------------------------------------------------------------------------------------------------------------------------------------------------------------------------------- Physical Exam BP 124/86 (BP Location: Left Arm, Patient Position: Sitting, Cuff Size: Large)   Pulse (!) 118   Ht 5\' 11"  (1.803 m)   Wt 225 lb (102.1 kg)   SpO2 96%   BMI 31.38 kg/m   Physical Exam Constitutional:      Appearance: Normal appearance.  HENT:     Head: Normocephalic and atraumatic.  Eyes:     General: No scleral icterus. Cardiovascular:     Rate and Rhythm: Normal rate and regular rhythm.  Pulmonary:     Effort: Pulmonary effort is normal.     Breath sounds: Normal breath sounds.  Musculoskeletal:     Cervical back: Neck supple.  Neurological:     Mental Status: He is alert.  Psychiatric:        Mood and Affect: Mood normal.        Behavior: Behavior normal.     ------------------------------------------------------------------------------------------------------------------------------------------------------------------------------------------------------------------- Assessment and Plan  Cough Testing for COVID, flu are negative.  Symptoms are consistent with viral etiology.  Recommend continued supportive care with increase fluids, continuation of Mucinex.  Delsym as needed for cough.  He does have Flonase at home and he will add back on.  Instructed to contact clinic if having new or worsening symptoms.   No orders of the defined types were placed in this encounter.   No follow-ups on file.    This visit occurred during the SARS-CoV-2 public health emergency.  Safety protocols were in place, including screening questions prior to the visit, additional usage of staff PPE, and extensive cleaning of exam room while observing appropriate contact time as indicated for disinfecting solutions.

## 2022-06-28 NOTE — Patient Instructions (Signed)

## 2022-06-29 LAB — POCT INFLUENZA A/B
Influenza A, POC: NEGATIVE
Influenza B, POC: NEGATIVE

## 2022-06-29 LAB — COMPLETE METABOLIC PANEL WITH GFR
AG Ratio: 2.2 (calc) (ref 1.0–2.5)
ALT: 20 U/L (ref 9–46)
AST: 16 U/L (ref 10–40)
Albumin: 4.8 g/dL (ref 3.6–5.1)
Alkaline phosphatase (APISO): 77 U/L (ref 36–130)
BUN: 15 mg/dL (ref 7–25)
CO2: 25 mmol/L (ref 20–32)
Calcium: 9.7 mg/dL (ref 8.6–10.3)
Chloride: 99 mmol/L (ref 98–110)
Creat: 0.95 mg/dL (ref 0.60–1.24)
Globulin: 2.2 g/dL (calc) (ref 1.9–3.7)
Glucose, Bld: 82 mg/dL (ref 65–99)
Potassium: 4 mmol/L (ref 3.5–5.3)
Sodium: 135 mmol/L (ref 135–146)
Total Bilirubin: 0.8 mg/dL (ref 0.2–1.2)
Total Protein: 7 g/dL (ref 6.1–8.1)
eGFR: 113 mL/min/{1.73_m2} (ref >=60)

## 2022-06-29 LAB — CBC WITH DIFFERENTIAL/PLATELET
Basophils Absolute: 32 cells/uL (ref 0–200)
HCT: 45.1 % (ref 38.5–50.0)
Hemoglobin: 15.6 g/dL (ref 13.2–17.1)
MCH: 30.5 pg (ref 27.0–33.0)
MCV: 88.3 fL (ref 80.0–100.0)
MPV: 10.3 fL (ref 7.5–12.5)
Monocytes Relative: 8 %
Platelets: 167 10*3/uL (ref 140–400)
RBC: 5.11 10*6/uL (ref 4.20–5.80)
Total Lymphocyte: 20.4 %

## 2022-06-29 LAB — LIPID PANEL W/REFLEX DIRECT LDL
Cholesterol: 197 mg/dL (ref <200)
HDL: 32 mg/dL — ABNORMAL LOW (ref >=40)
LDL Cholesterol (Calc): 126 mg/dL (calc) — ABNORMAL HIGH
Non-HDL Cholesterol (Calc): 165 mg/dL (calc) — ABNORMAL HIGH (ref <130)
Total CHOL/HDL Ratio: 6.2 (calc) — ABNORMAL HIGH (ref <5.0)
Triglycerides: 241 mg/dL — ABNORMAL HIGH (ref <150)

## 2022-06-29 LAB — POC COVID19 BINAXNOW: SARS Coronavirus 2 Ag: NEGATIVE

## 2022-06-29 LAB — POCT RAPID STREP A (OFFICE): Rapid Strep A Screen: NEGATIVE

## 2022-06-29 LAB — HEMOGLOBIN A1C
Hgb A1c MFr Bld: 4.7 % of total Hgb (ref <5.7)
Mean Plasma Glucose: 88 mg/dL
eAG (mmol/L): 4.9 mmol/L

## 2022-06-29 LAB — TSH: TSH: 3.35 mIU/L (ref 0.40–4.50)

## 2022-07-02 NOTE — Progress Notes (Signed)
Masoud,   Kidney, liver, glucose looks great.  A1C normal. No diabetes.  TSH is normal range but up from 2 years ago. Optimal level is 1-2. If you were having hypo thyroid symptoms could consider some supplementation otherwise will continue to monitor for any worsening changes.  TG up LDL not to goal.  HDL is low.   Watching sugars and carbs and getting 150 minutes of exercise a week can help with this. Ok to start fish oil as well 4000mg  daily. Recheck in 1 year.   Please send lab(fax) to [facility] in chart.

## 2022-07-24 ENCOUNTER — Other Ambulatory Visit: Payer: Self-pay

## 2022-07-24 ENCOUNTER — Encounter: Payer: Self-pay | Admitting: Physician Assistant

## 2022-07-24 DIAGNOSIS — Z79899 Other long term (current) drug therapy: Secondary | ICD-10-CM

## 2022-07-25 LAB — CBC WITH DIFFERENTIAL/PLATELET
Absolute Monocytes: 417 cells/uL (ref 200–950)
Basophils Absolute: 20 cells/uL (ref 0–200)
Basophils Relative: 0.4 %
Eosinophils Absolute: 0 cells/uL — ABNORMAL LOW (ref 15–500)
Eosinophils Relative: 0 %
HCT: 42.7 % (ref 38.5–50.0)
Hemoglobin: 14.8 g/dL (ref 13.2–17.1)
Lymphs Abs: 2136 cells/uL (ref 850–3900)
MCH: 30.8 pg (ref 27.0–33.0)
MCHC: 34.7 g/dL (ref 32.0–36.0)
MCV: 88.8 fL (ref 80.0–100.0)
MPV: 10.9 fL (ref 7.5–12.5)
Monocytes Relative: 8.5 %
Neutro Abs: 2328 cells/uL (ref 1500–7800)
Neutrophils Relative %: 47.5 %
Platelets: 158 10*3/uL (ref 140–400)
RBC: 4.81 10*6/uL (ref 4.20–5.80)
RDW: 13.2 % (ref 11.0–15.0)
Total Lymphocyte: 43.6 %
WBC: 4.9 10*3/uL (ref 3.8–10.8)

## 2022-07-26 NOTE — Progress Notes (Signed)
Please fax to [facility].

## 2022-08-29 ENCOUNTER — Encounter: Payer: Self-pay | Admitting: Physician Assistant

## 2022-08-29 ENCOUNTER — Other Ambulatory Visit: Payer: Self-pay

## 2022-08-29 DIAGNOSIS — Z79899 Other long term (current) drug therapy: Secondary | ICD-10-CM

## 2022-08-30 LAB — CBC WITH DIFFERENTIAL/PLATELET
Absolute Monocytes: 300 cells/uL (ref 200–950)
Basophils Absolute: 30 cells/uL (ref 0–200)
Basophils Relative: 0.6 %
Eosinophils Absolute: 0 cells/uL — ABNORMAL LOW (ref 15–500)
Eosinophils Relative: 0 %
HCT: 45.2 % (ref 38.5–50.0)
Hemoglobin: 15.3 g/dL (ref 13.2–17.1)
Lymphs Abs: 2200 cells/uL (ref 850–3900)
MCH: 30.3 pg (ref 27.0–33.0)
MCHC: 33.8 g/dL (ref 32.0–36.0)
MCV: 89.5 fL (ref 80.0–100.0)
MPV: 10.4 fL (ref 7.5–12.5)
Monocytes Relative: 6 %
Neutro Abs: 2470 cells/uL (ref 1500–7800)
Neutrophils Relative %: 49.4 %
Platelets: 177 10*3/uL (ref 140–400)
RBC: 5.05 10*6/uL (ref 4.20–5.80)
RDW: 13.1 % (ref 11.0–15.0)
Total Lymphocyte: 44 %
WBC: 5 10*3/uL (ref 3.8–10.8)

## 2022-08-31 NOTE — Progress Notes (Signed)
Please fax to [facility]. Stable CBC.

## 2022-09-25 ENCOUNTER — Other Ambulatory Visit: Payer: Self-pay

## 2022-09-25 ENCOUNTER — Encounter: Payer: Self-pay | Admitting: Physician Assistant

## 2022-09-25 ENCOUNTER — Ambulatory Visit: Payer: PRIVATE HEALTH INSURANCE | Admitting: Sports Medicine

## 2022-09-25 DIAGNOSIS — S93492A Sprain of other ligament of left ankle, initial encounter: Secondary | ICD-10-CM | POA: Diagnosis not present

## 2022-09-25 DIAGNOSIS — Z79899 Other long term (current) drug therapy: Secondary | ICD-10-CM

## 2022-09-25 DIAGNOSIS — S93402A Sprain of unspecified ligament of left ankle, initial encounter: Secondary | ICD-10-CM | POA: Insufficient documentation

## 2022-09-25 LAB — CBC WITH DIFFERENTIAL/PLATELET
Absolute Monocytes: 475 cells/uL (ref 200–950)
Basophils Absolute: 33 cells/uL (ref 0–200)
Basophils Relative: 0.5 %
Eosinophils Absolute: 0 cells/uL — ABNORMAL LOW (ref 15–500)
Eosinophils Relative: 0 %
HCT: 43.1 % (ref 38.5–50.0)
Hemoglobin: 15 g/dL (ref 13.2–17.1)
Lymphs Abs: 2373 cells/uL (ref 850–3900)
MCH: 30.6 pg (ref 27.0–33.0)
MCHC: 34.8 g/dL (ref 32.0–36.0)
MCV: 88 fL (ref 80.0–100.0)
MPV: 10.4 fL (ref 7.5–12.5)
Monocytes Relative: 7.3 %
Neutro Abs: 3621 cells/uL (ref 1500–7800)
Neutrophils Relative %: 55.7 %
Platelets: 173 10*3/uL (ref 140–400)
RBC: 4.9 10*6/uL (ref 4.20–5.80)
RDW: 13.3 % (ref 11.0–15.0)
Total Lymphocyte: 36.5 %
WBC: 6.5 10*3/uL (ref 3.8–10.8)

## 2022-09-25 MED ORDER — MELOXICAM 15 MG PO TABS: ORAL_TABLET | ORAL | 3 refills | Status: AC

## 2022-09-25 NOTE — Assessment & Plan Note (Signed)
Pleasant 27 year old male, inversion injury left ankle 1 week ago, able to bear weight, since then he has had pain lateral ankle, on exam he has minimal swelling, tenderness over the ATFL, no tenderness over the medial or lateral malleoli, no tenderness base of the fifth metatarsal, good motion, good strength, all ligaments are stable. Does not meet [place] ankle rules criteria for x-rays. We will add an ASO, meloxicam, home physical therapy, return to see me in about 3 weeks as needed.

## 2022-09-25 NOTE — Progress Notes (Signed)
    Procedures performed today:    None.  Independent interpretation of notes and tests performed by another provider:   None.  Brief History, Exam, Impression, and Recommendations:    Left ankle sprain Pleasant 27 year old male, inversion injury left ankle 1 week ago, able to bear weight, since then he has had pain lateral ankle, on exam he has minimal swelling, tenderness over the ATFL, no tenderness over the medial or lateral malleoli, no tenderness base of the fifth metatarsal, good motion, good strength, all ligaments are stable. Does not meet [place] ankle rules criteria for x-rays. We will add an ASO, meloxicam, home physical therapy, return to see me in about 3 weeks as needed.    ____________________________________________ Ihor Austin. Benjamin Stain, M.D., ABFM., CAQSM., AME. Primary Care and Sports Medicine [facility]  Adjunct Professor of Family Medicine  [employer]  Restaurant manager, fast food

## 2022-09-26 NOTE — Progress Notes (Signed)
Stable. Please forward to [facility] for review.

## 2022-10-18 ENCOUNTER — Ambulatory Visit: Payer: PRIVATE HEALTH INSURANCE | Admitting: Sports Medicine

## 2022-10-23 ENCOUNTER — Other Ambulatory Visit: Payer: Self-pay

## 2022-10-23 MED ORDER — METFORMIN HCL ER 500 MG PO TB24: ORAL_TABLET | ORAL | 2 refills | Status: DC

## 2022-10-24 ENCOUNTER — Other Ambulatory Visit: Payer: Self-pay | Admitting: Physician Assistant

## 2022-10-24 DIAGNOSIS — Z79899 Other long term (current) drug therapy: Secondary | ICD-10-CM

## 2022-10-28 ENCOUNTER — Encounter: Payer: Self-pay | Admitting: Physician Assistant

## 2022-10-30 ENCOUNTER — Encounter: Payer: Self-pay | Admitting: Physician Assistant

## 2022-10-30 DIAGNOSIS — Z79899 Other long term (current) drug therapy: Secondary | ICD-10-CM

## 2022-11-01 NOTE — Progress Notes (Signed)
Stable. Please forward/fax to [facility]

## 2022-11-21 LAB — HM COLONOSCOPY

## 2022-11-27 ENCOUNTER — Encounter: Payer: Self-pay | Admitting: Physician Assistant

## 2022-11-27 ENCOUNTER — Other Ambulatory Visit: Payer: Self-pay

## 2022-11-27 DIAGNOSIS — Z79899 Other long term (current) drug therapy: Secondary | ICD-10-CM

## 2022-11-29 LAB — CBC WITH DIFFERENTIAL/PLATELET
Basophils Absolute: 0 10*3/uL (ref 0.0–0.2)
Basos: 0 %
EOS (ABSOLUTE): 0.1 10*3/uL (ref 0.0–0.4)
Eos: 2 %
Hematocrit: 47.4 % (ref 37.5–51.0)
Hemoglobin: 15.6 g/dL (ref 13.0–17.7)
Immature Grans (Abs): 0 10*3/uL (ref 0.0–0.1)
Immature Granulocytes: 0 %
Lymphocytes Absolute: 2.2 10*3/uL (ref 0.7–3.1)
Lymphs: 40 %
MCH: 29.9 pg (ref 26.6–33.0)
MCHC: 32.9 g/dL (ref 31.5–35.7)
MCV: 91 fL (ref 79–97)
Monocytes Absolute: 0.4 10*3/uL (ref 0.1–0.9)
Monocytes: 7 %
Neutrophils Absolute: 2.8 10*3/uL (ref 1.4–7.0)
Neutrophils: 51 %
Platelets: 166 10*3/uL (ref 150–450)
RBC: 5.21 x10E6/uL (ref 4.14–5.80)
RDW: 13.3 % (ref 11.6–15.4)
WBC: 5.6 10*3/uL (ref 3.4–10.8)

## 2022-11-29 NOTE — Progress Notes (Signed)
Stable CBC. Forward/Fax to [facility].

## 2022-12-30 ENCOUNTER — Encounter: Payer: Self-pay | Admitting: Physician Assistant

## 2022-12-31 ENCOUNTER — Other Ambulatory Visit: Payer: Self-pay

## 2022-12-31 DIAGNOSIS — Z79899 Other long term (current) drug therapy: Secondary | ICD-10-CM

## 2023-01-01 ENCOUNTER — Encounter: Payer: Self-pay | Admitting: Physician Assistant

## 2023-01-02 LAB — CBC WITH DIFFERENTIAL/PLATELET
Basophils Absolute: 0 10*3/uL (ref 0.0–0.2)
Basos: 1 %
EOS (ABSOLUTE): 0 10*3/uL (ref 0.0–0.4)
Eos: 0 %
Hematocrit: 46.6 % (ref 37.5–51.0)
Hemoglobin: 15.8 g/dL (ref 13.0–17.7)
Immature Grans (Abs): 0 10*3/uL (ref 0.0–0.1)
Immature Granulocytes: 0 %
Lymphocytes Absolute: 1.9 10*3/uL (ref 0.7–3.1)
Lymphs: 31 %
MCH: 30.8 pg (ref 26.6–33.0)
MCHC: 33.9 g/dL (ref 31.5–35.7)
MCV: 91 fL (ref 79–97)
Monocytes Absolute: 0.5 10*3/uL (ref 0.1–0.9)
Monocytes: 8 %
Neutrophils Absolute: 3.6 10*3/uL (ref 1.4–7.0)
Neutrophils: 60 %
Platelets: 170 10*3/uL (ref 150–450)
RBC: 5.13 x10E6/uL (ref 4.14–5.80)
RDW: 13.5 % (ref 11.6–15.4)
WBC: 6 10*3/uL (ref 3.4–10.8)

## 2023-01-02 NOTE — Progress Notes (Signed)
Stable CBC. Please send to [facility].

## 2023-01-28 ENCOUNTER — Other Ambulatory Visit: Payer: Self-pay

## 2023-01-28 ENCOUNTER — Encounter: Payer: Self-pay | Admitting: Physician Assistant

## 2023-01-28 DIAGNOSIS — Z79899 Other long term (current) drug therapy: Secondary | ICD-10-CM

## 2023-01-30 LAB — CBC WITH DIFFERENTIAL/PLATELET
Basophils Absolute: 0 10*3/uL (ref 0.0–0.2)
Basos: 1 %
EOS (ABSOLUTE): 0 10*3/uL (ref 0.0–0.4)
Eos: 0 %
Hematocrit: 46.3 % (ref 37.5–51.0)
Hemoglobin: 15.6 g/dL (ref 13.0–17.7)
Immature Grans (Abs): 0 10*3/uL (ref 0.0–0.1)
Immature Granulocytes: 0 %
Lymphocytes Absolute: 2.2 10*3/uL (ref 0.7–3.1)
Lymphs: 41 %
MCH: 30.4 pg (ref 26.6–33.0)
MCHC: 33.7 g/dL (ref 31.5–35.7)
MCV: 90 fL (ref 79–97)
Monocytes Absolute: 0.4 10*3/uL (ref 0.1–0.9)
Monocytes: 8 %
Neutrophils Absolute: 2.8 10*3/uL (ref 1.4–7.0)
Neutrophils: 50 %
Platelets: 187 10*3/uL (ref 150–450)
RBC: 5.13 x10E6/uL (ref 4.14–5.80)
RDW: 13.3 % (ref 11.6–15.4)
WBC: 5.4 10*3/uL (ref 3.4–10.8)

## 2023-01-30 NOTE — Progress Notes (Signed)
Send to [facility].

## 2023-02-26 ENCOUNTER — Other Ambulatory Visit: Payer: Self-pay

## 2023-02-26 ENCOUNTER — Encounter: Payer: Self-pay | Admitting: Physician Assistant

## 2023-02-26 DIAGNOSIS — Z79899 Other long term (current) drug therapy: Secondary | ICD-10-CM

## 2023-02-28 LAB — CBC WITH DIFFERENTIAL/PLATELET
Basophils Absolute: 0 10*3/uL (ref 0.0–0.2)
Basos: 1 %
EOS (ABSOLUTE): 0.1 10*3/uL (ref 0.0–0.4)
Eos: 2 %
Hematocrit: 46.2 % (ref 37.5–51.0)
Hemoglobin: 15.7 g/dL (ref 13.0–17.7)
Immature Grans (Abs): 0 10*3/uL (ref 0.0–0.1)
Immature Granulocytes: 1 %
Lymphocytes Absolute: 2.3 10*3/uL (ref 0.7–3.1)
Lymphs: 39 %
MCH: 30.4 pg (ref 26.6–33.0)
MCHC: 34 g/dL (ref 31.5–35.7)
MCV: 89 fL (ref 79–97)
Monocytes Absolute: 0.5 10*3/uL (ref 0.1–0.9)
Monocytes: 8 %
Neutrophils Absolute: 3 10*3/uL (ref 1.4–7.0)
Neutrophils: 49 %
Platelets: 176 10*3/uL (ref 150–450)
RBC: 5.17 x10E6/uL (ref 4.14–5.80)
RDW: 13.3 % (ref 11.6–15.4)
WBC: 5.9 10*3/uL (ref 3.4–10.8)

## 2023-03-01 NOTE — Progress Notes (Signed)
Stable. Please send to [facility].

## 2023-03-17 ENCOUNTER — Other Ambulatory Visit: Payer: Self-pay | Admitting: Physician Assistant

## 2023-04-02 ENCOUNTER — Encounter: Payer: Self-pay | Admitting: Physician Assistant

## 2023-04-02 ENCOUNTER — Other Ambulatory Visit: Payer: Self-pay

## 2023-04-02 DIAGNOSIS — Z79899 Other long term (current) drug therapy: Secondary | ICD-10-CM

## 2023-04-04 LAB — CBC WITH DIFFERENTIAL/PLATELET
Basophils Absolute: 0 10*3/uL (ref 0.0–0.2)
Basos: 0 %
EOS (ABSOLUTE): 0 10*3/uL (ref 0.0–0.4)
Eos: 0 %
Hematocrit: 43.3 % (ref 37.5–51.0)
Hemoglobin: 15 g/dL (ref 13.0–17.7)
Immature Grans (Abs): 0 10*3/uL (ref 0.0–0.1)
Immature Granulocytes: 0 %
Lymphocytes Absolute: 2.7 10*3/uL (ref 0.7–3.1)
Lymphs: 36 %
MCH: 30.9 pg (ref 26.6–33.0)
MCHC: 34.6 g/dL (ref 31.5–35.7)
MCV: 89 fL (ref 79–97)
Monocytes Absolute: 0.6 10*3/uL (ref 0.1–0.9)
Monocytes: 8 %
Neutrophils Absolute: 4.2 10*3/uL (ref 1.4–7.0)
Neutrophils: 56 %
Platelets: 169 10*3/uL (ref 150–450)
RBC: 4.85 x10E6/uL (ref 4.14–5.80)
RDW: 13.6 % (ref 11.6–15.4)
WBC: 7.5 10*3/uL (ref 3.4–10.8)

## 2023-04-04 NOTE — Progress Notes (Signed)
Send to [facility].

## 2023-04-22 ENCOUNTER — Ambulatory Visit: Payer: PRIVATE HEALTH INSURANCE | Admitting: Physician Assistant

## 2023-04-22 ENCOUNTER — Encounter: Payer: Self-pay | Admitting: Physician Assistant

## 2023-04-22 VITALS — BP 117/65 | HR 101 | Temp 98.7°F | Ht 71.0 in | Wt 210.0 lb

## 2023-04-22 DIAGNOSIS — R6889 Other general symptoms and signs: Secondary | ICD-10-CM | POA: Diagnosis not present

## 2023-04-22 DIAGNOSIS — J069 Acute upper respiratory infection, unspecified: Secondary | ICD-10-CM | POA: Diagnosis not present

## 2023-04-22 LAB — POC COVID19 BINAXNOW: SARS Coronavirus 2 Ag: NEGATIVE

## 2023-04-22 LAB — POCT INFLUENZA A/B
Influenza A, POC: NEGATIVE
Influenza B, POC: NEGATIVE

## 2023-04-22 MED ORDER — OSELTAMIVIR PHOSPHATE 75 MG PO CAPS
75.0000 mg | ORAL_CAPSULE | Freq: Two times a day (BID) | ORAL | 0 refills | Status: DC
Start: 2023-04-22 — End: 2023-07-26

## 2023-04-22 MED ORDER — ONDANSETRON 8 MG PO TBDP: 8.0000 mg | ORAL_TABLET | Freq: Three times a day (TID) | ORAL | 1 refills | Status: DC | PRN

## 2023-04-22 NOTE — Patient Instructions (Signed)
Upper Respiratory Infection, Adult An upper respiratory infection (URI) affects the nose, throat, and upper airways that lead to the lungs. The most common type of URI is often called the common cold. URIs usually get better on their own, without medical treatment. What are the causes? A URI is caused by a germ (virus). You may catch these germs by: Breathing in droplets from an infected person's cough or sneeze. Touching something that has the germ on it (is contaminated) and then touching your mouth, nose, or eyes. What increases the risk? You are more likely to get a URI if: You are very young or very old. You have close contact with others, such as at work, school, or a health care facility. You smoke. You have long-term (chronic) heart or lung disease. You have a weakened disease-fighting system (immune system). You have nasal allergies or asthma. You have a lot of stress. You have poor nutrition. What are the signs or symptoms? Runny or stuffy (congested) nose. Cough. Sneezing. Sore throat. Headache. Feeling tired (fatigue). Fever. Not wanting to eat as much as usual. Pain in your forehead, behind your eyes, and over your cheekbones (sinus pain). Muscle aches. Redness or irritation of the eyes. Pressure in the ears or face. How is this treated? URIs usually get better on their own within 7-10 days. Medicines cannot cure URIs, but your doctor may recommend certain medicines to help relieve symptoms, such as: Over-the-counter cold medicines. Medicines to reduce coughing (cough suppressants). Coughing is a type of defense against infection that helps to clear the nose, throat, windpipe, and lungs (respiratory system). Take these medicines only as told by your doctor. Medicines to lower your fever. Follow these instructions at home: Activity Rest as needed. If you have a fever, stay home from work or school until your fever is gone, or until your doctor says you may return to  work or school. You should stay home until you cannot spread the infection anymore (you are not contagious). Your doctor may have you wear a face mask so you have less risk of spreading the infection. Relieving symptoms Rinse your mouth often with salt water. To make salt water, dissolve -1 tsp (3-6 g) of salt in 1 cup (237 mL) of warm water. Use a cool-mist humidifier to add moisture to the air. This can help you breathe more easily. Eating and drinking  Drink enough fluid to keep your pee (urine) pale yellow. Eat soups and other clear broths. General instructions  Take over-the-counter and prescription medicines only as told by your doctor. Do not smoke or use any products that contain nicotine or tobacco. If you need help quitting, ask your doctor. Avoid being where people are smoking (avoid secondhand smoke). Stay up to date on all your shots (immunizations), and get the flu shot every year. Keep all follow-up visits. How to prevent the spread of infection to others  Wash your hands with soap and water for at least 20 seconds. If you cannot use soap and water, use hand sanitizer. Avoid touching your mouth, face, eyes, or nose. Cough or sneeze into a tissue or your sleeve or elbow. Do not cough or sneeze into your hand or into the air. Contact a doctor if: You are getting worse, not better. You have any of these: A fever or chills. Brown or red mucus in your nose. Yellow or brown fluid (discharge)coming from your nose. Pain in your face, especially when you bend forward. Swollen neck glands. Pain when you swallow.  White areas in the back of your throat. Get help right away if: You have shortness of breath that gets worse. You have very bad or constant: Headache. Ear pain. Pain in your forehead, behind your eyes, and over your cheekbones (sinus pain). Chest pain. You have long-lasting (chronic) lung disease along with any of these: Making high-pitched whistling sounds when  you breathe, most often when you breathe out (wheezing). Long-lasting cough (more than 14 days). Coughing up blood. A change in your usual mucus. You have a stiff neck. You have changes in your: Vision. Hearing. Thinking. Mood. These symptoms may be an emergency. Get help right away. Call 911. Do not wait to see if the symptoms will go away. Do not drive yourself to the hospital. Summary An upper respiratory infection (URI) is caused by a germ (virus). The most common type of URI is often called the common cold. URIs usually get better within 7-10 days. Take over-the-counter and prescription medicines only as told by your doctor. This information is not intended to replace advice given to you by your health care provider. Make sure you discuss any questions you have with your health care provider. Document Revised: 10/05/2020 Document Reviewed: 10/05/2020 Elsevier Patient Education  2024 Elsevier Inc.Food Choices to Help Relieve Diarrhea, Adult Diarrhea can make you feel weak and cause you to become dehydrated. Dehydration is a condition in which there is not enough water or other fluids in the body. It is important to choose the right foods and drinks to: Relieve diarrhea. Replace lost fluids and nutrients. Prevent dehydration. What are tips for following this plan? Relieving diarrhea Avoid foods that make your diarrhea worse. These may include: Foods and drinks that are sweetened with high-fructose corn syrup, honey, or sweeteners such as xylitol, sorbitol, and mannitol. Check food labels for these ingredients. Fried, greasy, or spicy foods. Raw fruits and vegetables. Eat foods that are rich in probiotics. These include foods such as yogurt and fermented milk products. Probiotics can help increase healthy bacteria in your stomach and intestines (gastrointestinal or GI tract). This may help digestion and stop diarrhea. If you have lactose intolerance, avoid dairy products. These may  make your diarrhea worse. Take medicine to help stop diarrhea only as told by your health care provider. Replacing nutrients  Eat bland, easy-to-digest foods in small amounts as you are able, until your diarrhea starts to get better. These foods include bananas, applesauce, rice, toast, and crackers. Over time, add nutrient-rich foods as your body tolerates them or as told by your health care provider. These include: Well-cooked protein foods, such as eggs, lean meats like fish or chicken without skin, and tofu. Peeled, seeded, and soft-cooked fruits and vegetables. Low-fat dairy products. Whole grains. Take vitamin and mineral supplements as told by your health care provider. Preventing dehydration  Start by sipping water or a solution to prevent dehydration (oral rehydration solution, or ORS). This is a drink that helps replace fluids and minerals your body has lost. You can buy an ORS at pharmacies and retail stores. Try to drink at least 8-10 cups (2,000-2,500 mL) of fluid each day to help replace lost fluids. If your urine is pale yellow, you are getting enough fluids. You may drink other liquids in addition to water, such as fruit juice that you have added water to (diluted fruit juice) or low-calorie sports drinks, as tolerated or as told by your health care provider. Avoid drinks with caffeine, such as coffee, tea, or soft drinks. Avoid alcohol.  This information is not intended to replace advice given to you by your health care provider. Make sure you discuss any questions you have with your health care provider. Document Revised: 08/22/2021 Document Reviewed: 08/22/2021 Elsevier Patient Education  2024 [employer].

## 2023-04-22 NOTE — Progress Notes (Unsigned)
Established Patient Office Visit  Subjective   Patient ID: Joshua Tyler, male    DOB: 07/22/1995  Age: 28 y.o. MRN: [id number]  Chief Complaint  Patient presents with   Cough     Flu and covid test    HPI  Pt is a 28 yo male presenting to the clinic today with flu-like sxs x 1 day. Pt states that he was exposed to his mother and in-laws this last week and they all tested positive for the flu. He states that he has been experiencing body aches, chills, chest congestion, productive cough, headaches and N/V/D. Pt states that he has had 3 loose stools and 2 episodes of vomiting in the last 24 hours. He has been taking Tylenol and that has helped with his body aches, but he has not taken any medication for his cough. He denies having a fever, but he does not have a thermometer at home to check his temperature. He has been trying to stay hydrated but is having some difficulty d/t the N/V/D.  He denies CP, SOB, wheezing, palpitations, or dizziness.  Review of Systems  Constitutional:  Positive for chills and malaise/fatigue.  HENT:  Positive for congestion.   Respiratory:  Positive for cough and sputum production.   Gastrointestinal:  Positive for abdominal pain, diarrhea, nausea and vomiting.  Neurological:  Positive for headaches.  All other systems reviewed and are negative.  Active Ambulatory Problems    Diagnosis Date Noted   Schizoaffective disorder, bipolar type (HCC) 10/10/2012   Schizoaffective disorder (HCC) 07/21/2017   Gastroesophageal reflux disease with esophagitis 07/21/2017   Spleen enlarged 07/30/2017   Status post cholecystectomy 09/30/2017   Palpitations 05/01/2018   Sinus tachycardia by electrocardiogram 10/27/2018   PFO (patent foramen ovale) 10/28/2018   Right axis deviation 10/28/2018   Acute intractable headache 10/28/2018   Abnormality of fibula 11/05/2018   Bone thickening 11/07/2018   Lower leg mass, left 11/07/2018   Pain of left fibula 11/07/2018    Left medial tibial stress syndrome 11/20/2018   Deviated septum 06/03/2019   Seasonal allergic rhinitis 12/07/2015   Near syncope 01/29/2020   POTS (postural orthostatic tachycardia syndrome) 01/29/2020   tachycardia 01/29/2020   Orthostatic hypotension 01/29/2020   Abdominal cramping 09/17/2016   ANA positive 10/26/2015   Anxiety 05/23/2017   OCD (obsessive compulsive disorder) 05/23/2017   Chronic fatigue 11/17/2015   Arthralgia 10/23/2015   Generalized abdominal pain 04/25/2018   GERD (gastroesophageal reflux disease) 07/10/2018   High risk medication use 09/26/2015   Hypersomnolence 09/17/2016   Irritable bowel syndrome 07/10/2018   Major depressive disorder, single episode 03/02/2011   Nasal obstruction 10/12/2019   Vitamin D deficiency 10/26/2015   Cough 08/05/2020   Acute pain of left shoulder 08/05/2020   Epigastric pain 01/12/2021   Left testicular pain 01/12/2021   Left ankle sprain 09/25/2022   Resolved Ambulatory Problems    Diagnosis Date Noted   Dysuria 10/02/2017   Left to right cardiac shunt 10/28/2018   Past Medical History:  Diagnosis Date   Bipolar 1 disorder (HCC)      Objective:     BP 117/65   Pulse (!) 101   Temp 98.7 F (37.1 C) (Oral)   Ht 5\' 11"  (1.803 m)   Wt 95.3 kg   SpO2 99%   BMI 29.29 kg/m   BP Readings from Last 3 Encounters:  04/22/23 117/65  06/28/22 124/86  06/05/22 120/78   Wt Readings from Last 3 Encounters:  04/22/23  95.3 kg  06/28/22 102.1 kg  06/05/22 102.5 kg     Physical Exam Constitutional:      Appearance: Normal appearance.  HENT:     Head: Normocephalic.     Right Ear: Tympanic membrane, ear canal and external ear normal.     Left Ear: Tympanic membrane, ear canal and external ear normal.     Nose: Congestion present.     Mouth/Throat:     Mouth: Mucous membranes are moist.     Pharynx: Oropharynx is clear. Posterior oropharyngeal erythema present.  Cardiovascular:     Rate and Rhythm: Normal rate  and regular rhythm.     Heart sounds: Normal heart sounds.  Pulmonary:     Effort: Pulmonary effort is normal.     Breath sounds: Normal breath sounds.  Abdominal:     General: Bowel sounds are normal.     Palpations: Abdomen is soft.  Neurological:     General: No focal deficit present.     Mental Status: He is alert and oriented to person, place, and time.  Psychiatric:        Mood and Affect: Mood normal.    Results for orders placed or performed in visit on 04/22/23  POC COVID-19  Result Value Ref Range   SARS Coronavirus 2 Ag Negative Negative  POCT Influenza A/B  Result Value Ref Range   Influenza A, POC Negative Negative   Influenza B, POC Negative Negative    {Labs (Optional):23779}     Assessment & Plan:   Joshua Tyler was seen today for cough.  Diagnoses and all orders for this visit:  Flu-like symptoms -     POC COVID-19 -     POCT Influenza A/B  Other orders -     oseltamivir (TAMIFLU) 75 MG capsule; Take 1 capsule (75 mg total) by mouth 2 (two) times daily. -     ondansetron (ZOFRAN-ODT) 8 MG disintegrating tablet; Take 1 tablet (8 mg total) by mouth every 8 (eight) hours as needed.   POCT Covid/flu negative  Take Mucinex, Delsym, Theraflu or AlkaSeltzer as needed for flu sxs  Take at home flu test tomorrow; if results are positive take Tamiflu to help with sxs Zofran sent to pharmacy for nausea/vomiting  Stick to [employer] for nausea/vomiting/diarrhea and try to eat small meals Continue to drink plenty of fluids to stay hydrated  Follow up if symptoms worsen or do not improve  No follow-ups on file.    Windy Fast [place], Student-PA

## 2023-04-23 ENCOUNTER — Encounter: Payer: Self-pay | Admitting: Physician Assistant

## 2023-04-30 ENCOUNTER — Encounter: Payer: Self-pay | Admitting: Physician Assistant

## 2023-04-30 ENCOUNTER — Other Ambulatory Visit: Payer: Self-pay | Admitting: Physician Assistant

## 2023-04-30 DIAGNOSIS — Z79899 Other long term (current) drug therapy: Secondary | ICD-10-CM

## 2023-05-03 ENCOUNTER — Encounter: Payer: Self-pay | Admitting: Physician Assistant

## 2023-05-03 LAB — CBC WITH DIFFERENTIAL/PLATELET
Basophils Absolute: 0 10*3/uL (ref 0.0–0.2)
Basos: 1 %
EOS (ABSOLUTE): 0.1 10*3/uL (ref 0.0–0.4)
Eos: 2 %
Hematocrit: 43.5 % (ref 37.5–51.0)
Hemoglobin: 15 g/dL (ref 13.0–17.7)
Immature Grans (Abs): 0 10*3/uL (ref 0.0–0.1)
Immature Granulocytes: 0 %
Lymphocytes Absolute: 2.3 10*3/uL (ref 0.7–3.1)
Lymphs: 37 %
MCH: 31.1 pg (ref 26.6–33.0)
MCHC: 34.5 g/dL (ref 31.5–35.7)
MCV: 90 fL (ref 79–97)
Monocytes Absolute: 0.4 10*3/uL (ref 0.1–0.9)
Monocytes: 7 %
Neutrophils Absolute: 3.3 10*3/uL (ref 1.4–7.0)
Neutrophils: 53 %
Platelets: 161 10*3/uL (ref 150–450)
RBC: 4.83 x10E6/uL (ref 4.14–5.80)
RDW: 13.1 % (ref 11.6–15.4)
WBC: 6.2 10*3/uL (ref 3.4–10.8)

## 2023-05-03 NOTE — Progress Notes (Signed)
No concerns. Send to [facility].

## 2023-05-22 ENCOUNTER — Encounter: Payer: Self-pay | Admitting: Physician Assistant

## 2023-05-22 ENCOUNTER — Other Ambulatory Visit: Payer: Self-pay

## 2023-05-22 DIAGNOSIS — Z79899 Other long term (current) drug therapy: Secondary | ICD-10-CM

## 2023-05-25 LAB — CBC WITH DIFFERENTIAL/PLATELET
Basophils Absolute: 0 10*3/uL (ref 0.0–0.2)
Basos: 1 %
EOS (ABSOLUTE): 0 10*3/uL (ref 0.0–0.4)
Eos: 0 %
Hematocrit: 45.6 % (ref 37.5–51.0)
Hemoglobin: 15.3 g/dL (ref 13.0–17.7)
Immature Grans (Abs): 0 10*3/uL (ref 0.0–0.1)
Immature Granulocytes: 0 %
Lymphocytes Absolute: 2.5 10*3/uL (ref 0.7–3.1)
Lymphs: 47 %
MCH: 30.2 pg (ref 26.6–33.0)
MCHC: 33.6 g/dL (ref 31.5–35.7)
MCV: 90 fL (ref 79–97)
Monocytes Absolute: 0.3 10*3/uL (ref 0.1–0.9)
Monocytes: 6 %
Neutrophils Absolute: 2.4 10*3/uL (ref 1.4–7.0)
Neutrophils: 46 %
Platelets: 174 10*3/uL (ref 150–450)
RBC: 5.06 x10E6/uL (ref 4.14–5.80)
RDW: 13.6 % (ref 11.6–15.4)
WBC: 5.3 10*3/uL (ref 3.4–10.8)

## 2023-05-26 ENCOUNTER — Encounter: Payer: Self-pay | Admitting: Physician Assistant

## 2023-05-26 ENCOUNTER — Other Ambulatory Visit: Payer: Self-pay | Admitting: Physician Assistant

## 2023-05-26 DIAGNOSIS — I951 Orthostatic hypotension: Secondary | ICD-10-CM

## 2023-05-26 DIAGNOSIS — G90A Postural orthostatic tachycardia syndrome (POTS): Secondary | ICD-10-CM

## 2023-05-26 NOTE — Progress Notes (Signed)
 Send to [facility].

## 2023-06-27 ENCOUNTER — Encounter: Payer: Self-pay | Admitting: Physician Assistant

## 2023-06-28 ENCOUNTER — Other Ambulatory Visit: Payer: Self-pay

## 2023-06-28 DIAGNOSIS — Z79899 Other long term (current) drug therapy: Secondary | ICD-10-CM

## 2023-07-03 ENCOUNTER — Encounter: Payer: Self-pay | Admitting: Physician Assistant

## 2023-07-03 LAB — CBC WITH DIFFERENTIAL/PLATELET
Basophils Absolute: 0 10*3/uL (ref 0.0–0.2)
Basos: 0 %
EOS (ABSOLUTE): 0 10*3/uL (ref 0.0–0.4)
Eos: 0 %
Hematocrit: 45.4 % (ref 37.5–51.0)
Hemoglobin: 15.7 g/dL (ref 13.0–17.7)
Immature Grans (Abs): 0 10*3/uL (ref 0.0–0.1)
Immature Granulocytes: 0 %
Lymphocytes Absolute: 2.1 10*3/uL (ref 0.7–3.1)
Lymphs: 25 %
MCH: 31.1 pg (ref 26.6–33.0)
MCHC: 34.6 g/dL (ref 31.5–35.7)
MCV: 90 fL (ref 79–97)
Monocytes Absolute: 0.6 10*3/uL (ref 0.1–0.9)
Monocytes: 7 %
Neutrophils Absolute: 5.5 10*3/uL (ref 1.4–7.0)
Neutrophils: 68 %
Platelets: 174 10*3/uL (ref 150–450)
RBC: 5.05 x10E6/uL (ref 4.14–5.80)
RDW: 13.7 % (ref 11.6–15.4)
WBC: 8.2 10*3/uL (ref 3.4–10.8)

## 2023-07-03 NOTE — Progress Notes (Signed)
 Please forward to [facility]. Stable CBC.

## 2023-07-18 ENCOUNTER — Other Ambulatory Visit: Payer: Self-pay | Admitting: Physician Assistant

## 2023-07-18 DIAGNOSIS — G90A Postural orthostatic tachycardia syndrome (POTS): Secondary | ICD-10-CM

## 2023-07-18 DIAGNOSIS — I951 Orthostatic hypotension: Secondary | ICD-10-CM

## 2023-07-26 ENCOUNTER — Ambulatory Visit (INDEPENDENT_AMBULATORY_CARE_PROVIDER_SITE_OTHER): Payer: PRIVATE HEALTH INSURANCE | Admitting: Physician Assistant

## 2023-07-26 ENCOUNTER — Encounter: Payer: Self-pay | Admitting: Physician Assistant

## 2023-07-26 VITALS — BP 104/74 | HR 75 | Ht 71.0 in | Wt 206.0 lb

## 2023-07-26 DIAGNOSIS — G90A Postural orthostatic tachycardia syndrome (POTS): Secondary | ICD-10-CM | POA: Diagnosis not present

## 2023-07-26 DIAGNOSIS — Z0000 Encounter for general adult medical examination without abnormal findings: Secondary | ICD-10-CM

## 2023-07-26 DIAGNOSIS — F250 Schizoaffective disorder, bipolar type: Secondary | ICD-10-CM | POA: Diagnosis not present

## 2023-07-26 DIAGNOSIS — Z23 Encounter for immunization: Secondary | ICD-10-CM

## 2023-07-26 DIAGNOSIS — Z3009 Encounter for other general counseling and advice on contraception: Secondary | ICD-10-CM

## 2023-07-26 DIAGNOSIS — Z131 Encounter for screening for diabetes mellitus: Secondary | ICD-10-CM

## 2023-07-26 DIAGNOSIS — Z79899 Other long term (current) drug therapy: Secondary | ICD-10-CM

## 2023-07-26 DIAGNOSIS — Z13220 Encounter for screening for lipoid disorders: Secondary | ICD-10-CM

## 2023-07-26 DIAGNOSIS — E559 Vitamin D deficiency, unspecified: Secondary | ICD-10-CM

## 2023-07-26 DIAGNOSIS — E88819 Insulin resistance, unspecified: Secondary | ICD-10-CM

## 2023-07-26 NOTE — Patient Instructions (Signed)
 Health Maintenance, Male  Adopting a healthy lifestyle and getting preventive care are important in promoting health and wellness. Ask your health care provider about:  The right schedule for you to have regular tests and exams.  Things you can do on your own to prevent diseases and keep yourself healthy.  What should I know about diet, weight, and exercise?  Eat a healthy diet    Eat a diet that includes plenty of vegetables, fruits, low-fat dairy products, and lean protein.  Do not eat a lot of foods that are high in solid fats, added sugars, or sodium.  Maintain a healthy weight  Body mass index (BMI) is a measurement that can be used to identify possible weight problems. It estimates body fat based on height and weight. Your health care provider can help determine your BMI and help you achieve or maintain a healthy weight.  Get regular exercise  Get regular exercise. This is one of the most important things you can do for your health. Most adults should:  Exercise for at least 150 minutes each week. The exercise should increase your heart rate and make you sweat (moderate-intensity exercise).  Do strengthening exercises at least twice a week. This is in addition to the moderate-intensity exercise.  Spend less time sitting. Even light physical activity can be beneficial.  Watch cholesterol and blood lipids  Have your blood tested for lipids and cholesterol at 29 years of age, then have this test every 5 years.  You may need to have your cholesterol levels checked more often if:  Your lipid or cholesterol levels are high.  You are older than 29 years of age.  You are at high risk for heart disease.  What should I know about cancer screening?  Many types of cancers can be detected early and may often be prevented. Depending on your health history and family history, you may need to have cancer screening at various ages. This may include screening for:  Colorectal cancer.  Prostate cancer.  Skin cancer.  Lung  cancer.  What should I know about heart disease, diabetes, and high blood pressure?  Blood pressure and heart disease  High blood pressure causes heart disease and increases the risk of stroke. This is more likely to develop in people who have high blood pressure readings or are overweight.  Talk with your health care provider about your target blood pressure readings.  Have your blood pressure checked:  Every 3-5 years if you are 9-95 years of age.  Every year if you are 85 years old or older.  If you are between the ages of 29 and 29 and are a current or former smoker, ask your health care provider if you should have a one-time screening for abdominal aortic aneurysm (AAA).  Diabetes  Have regular diabetes screenings. This checks your fasting blood sugar level. Have the screening done:  Once every three years after age 23 if you are at a normal weight and have a low risk for diabetes.  More often and at a younger age if you are overweight or have a high risk for diabetes.  What should I know about preventing infection?  Hepatitis B  If you have a higher risk for hepatitis B, you should be screened for this virus. Talk with your health care provider to find out if you are at risk for hepatitis B infection.  Hepatitis C  Blood testing is recommended for:  Everyone born from [birth year] through 1966.  Anyone  with known risk factors for hepatitis C.  Sexually transmitted infections (STIs)  You should be screened each year for STIs, including gonorrhea and chlamydia, if:  You are sexually active and are younger than 29 years of age.  You are older than 29 years of age and your health care provider tells you that you are at risk for this type of infection.  Your sexual activity has changed since you were last screened, and you are at increased risk for chlamydia or gonorrhea. Ask your health care provider if you are at risk.  Ask your health care provider about whether you are at high risk for HIV. Your health care provider  may recommend a prescription medicine to help prevent HIV infection. If you choose to take medicine to prevent HIV, you should first get tested for HIV. You should then be tested every 3 months for as long as you are taking the medicine.  Follow these instructions at home:  Alcohol use  Do not drink alcohol if your health care provider tells you not to drink.  If you drink alcohol:  Limit how much you have to 0-2 drinks a day.  Know how much alcohol is in your drink. In the [place] one drink equals one 12 oz bottle of beer (355 mL), one 5 oz glass of wine (148 mL), or one 1 oz glass of hard liquor (44 mL).  Lifestyle  Do not use any products that contain nicotine or tobacco. These products include cigarettes, chewing tobacco, and vaping devices, such as e-cigarettes. If you need help quitting, ask your health care provider.  Do not use street drugs.  Do not share needles.  Ask your health care provider for help if you need support or information about quitting drugs.  General instructions  Schedule regular health, dental, and eye exams.  Stay current with your vaccines.  Tell your health care provider if:  You often feel depressed.  You have ever been abused or do not feel safe at home.  Summary  Adopting a healthy lifestyle and getting preventive care are important in promoting health and wellness.  Follow your health care provider's instructions about healthy diet, exercising, and getting tested or screened for diseases.  Follow your health care provider's instructions on monitoring your cholesterol and blood pressure.  This information is not intended to replace advice given to you by your health care provider. Make sure you discuss any questions you have with your health care provider.  Document Revised: 07/25/2020 Document Reviewed: 07/25/2020  Elsevier Patient Education  2024 [employer].

## 2023-07-27 LAB — CMP14+EGFR

## 2023-07-28 ENCOUNTER — Encounter: Payer: Self-pay | Admitting: Physician Assistant

## 2023-07-28 MED ORDER — METFORMIN HCL ER 500 MG PO TB24: ORAL_TABLET | ORAL | 3 refills | Status: DC

## 2023-07-28 NOTE — Progress Notes (Signed)
 Complete physical exam  Patient: Joshua Tyler   DOB: 1995-07-14   27 y.o. Male  MRN: [id number]  Subjective:     Chief Complaint  Patient presents with   Annual Exam    Fasting labs    Selah Baldridge is a 28 y.o. male who presents today for a complete physical exam. He reports consuming a general diet. He lifts weights 3 time a week and walks the other days.  He generally feels well. He reports sleeping well. He does have additional problems to discuss today.   He and his wife have decided they do not want children and he wants a vasectomy. He needs referral.    Most recent fall risk assessment:    07/28/2023    5:34 PM  Fall Risk   Falls in the past year? 0  Number falls in past yr: 0  Injury with Fall? 0  Risk for fall due to : No Fall Risks     Most recent depression screenings:    07/26/2023   11:10 AM 03/17/2019    1:53 PM  PHQ 2/9 Scores  PHQ - 2 Score 3 2  PHQ- 9 Score 12 9    Vision:Within last year and Dental: No current dental problems and Receives regular dental care  Patient Active Problem List   Diagnosis Date Noted   Left ankle sprain 09/25/2022   Epigastric pain 01/12/2021   Left testicular pain 01/12/2021   Cough 08/05/2020   Acute pain of left shoulder 08/05/2020   Near syncope 01/29/2020   POTS (postural orthostatic tachycardia syndrome) 01/29/2020   tachycardia 01/29/2020   Orthostatic hypotension 01/29/2020   Nasal obstruction 10/12/2019   Deviated septum 06/03/2019   Left medial tibial stress syndrome 11/20/2018   Bone thickening 11/07/2018   Lower leg mass, left 11/07/2018   Pain of left fibula 11/07/2018   Abnormality of fibula 11/05/2018   PFO (patent foramen ovale) 10/28/2018   Right axis deviation 10/28/2018   Acute intractable headache 10/28/2018   Sinus tachycardia by electrocardiogram 10/27/2018   GERD (gastroesophageal reflux disease) 07/10/2018   Irritable bowel syndrome 07/10/2018   Palpitations 05/01/2018   Generalized  abdominal pain 04/25/2018   Status post cholecystectomy 09/30/2017   Spleen enlarged 07/30/2017   Schizoaffective disorder (HCC) 07/21/2017   Gastroesophageal reflux disease with esophagitis 07/21/2017   Anxiety 05/23/2017   OCD (obsessive compulsive disorder) 05/23/2017   Abdominal cramping 09/17/2016   Hypersomnolence 09/17/2016   Seasonal allergic rhinitis 12/07/2015   Chronic fatigue 11/17/2015   ANA positive 10/26/2015   Vitamin D deficiency 10/26/2015   Arthralgia 10/23/2015   High risk medication use 09/26/2015   Schizoaffective disorder, bipolar type (HCC) 10/10/2012   Major depressive disorder, single episode 03/02/2011   Past Medical History:  Diagnosis Date   Abdominal cramping 09/17/2016   Abnormality of fibula 11/05/2018   Diffuse cortical thickening   Acute intractable headache 10/28/2018   ANA positive 10/26/2015   Anxiety 05/23/2017   Arthralgia 10/23/2015   Bipolar 1 disorder (HCC)    Bone thickening 11/07/2018   Chronic fatigue 11/17/2015   Gastroesophageal reflux disease with esophagitis 07/21/2017   Generalized abdominal pain 04/25/2018   Formatting of this note might be different from the original. episodic   GERD (gastroesophageal reflux disease) 07/10/2018   High risk medication use 09/26/2015   Hypersomnolence 09/17/2016   Irritable bowel syndrome 07/10/2018   Left medial tibial stress syndrome 11/20/2018   Left to right cardiac shunt 10/28/2018   Lower leg  mass, left 11/07/2018   Major depressive disorder, single episode 03/02/2011   Nasal obstruction 10/12/2019   Formatting of this note might be different from the original. Formatting of this note might be different from the original. Added automatically from request for surgery [id number] Formatting of this note might be different from the original. Added automatically from request for surgery [id number]   Near syncope 01/29/2020   OCD (obsessive compulsive disorder) 05/23/2017   Orthostatic hypotension 01/29/2020   Pain of  left fibula 11/07/2018   Palpitations 05/01/2018   PFO (patent foramen ovale) 10/28/2018   POTS (postural orthostatic tachycardia syndrome) 01/29/2020   Right axis deviation 10/28/2018   Schizoaffective disorder (HCC)    Schizoaffective disorder (HCC) 07/21/2017   Schizoaffective disorder, bipolar type (HCC) 10/10/2012   Sinus tachycardia by electrocardiogram 10/27/2018   Spleen enlarged 07/30/2017   13cm on u/s 07/2017.    Status post cholecystectomy 09/30/2017   tachycardia 01/29/2020   Vitamin D deficiency 10/26/2015   Formatting of this note might be different from the original. (low)   Past Surgical History:  Procedure Laterality Date   CHOLECYSTECTOMY  2019   WISDOM TOOTH EXTRACTION     Family History  Problem Relation Age of Onset   Heart disease Father    Heart attack Paternal Grandfather    Cancer Paternal Grandfather        colon and prostate   No Known Allergies    Patient Care Team: Gennaro Lizotte L, PA-C as PCP - General (Family Medicine) Tobb, Kardie, DO as PCP - Cardiology (Cardiology) Woodroe Hazel, MD as Referring Physician (Psychiatry) Gean Keels, MD as Consulting Physician (Sports Medicine) Alverta Johns, Pamala Bodo, MD as Consulting Physician (Psychiatry) Deliah Fells, MD as Referring Physician (Psychiatry)   Outpatient Medications Prior to Visit  Medication Sig   ARIPiprazole  (ABILIFY ) 10 MG tablet Take 10 mg by mouth.   buPROPion  (WELLBUTRIN  XL) 150 MG 24 hr tablet Take 450 mg by mouth.   clonazePAM  (KLONOPIN ) 0.5 MG tablet Take 0.25 mg by mouth.   cloZAPine  (CLOZARIL ) 100 MG tablet Take 125 mg by mouth daily.   dicyclomine  (BENTYL ) 20 MG tablet Take 1 tablet (20 mg total) by mouth 3 (three) times daily as needed for spasms.   divalproex (DEPAKOTE) 250 MG DR tablet 1 in the AM, 2 at bedtime   fluticasone  (FLONASE ) 50 MCG/ACT nasal spray SPRAY 2 SPRAYS INTO EACH NOSTRIL EVERY DAY   fluvoxaMINE (LUVOX) 100 MG tablet Take 150 mg by mouth daily.    loratadine  (CLARITIN ) 10 MG tablet Take 10 mg by mouth daily.   meloxicam  (MOBIC ) 15 MG tablet One tab PO every 24 hours with a meal for 2 weeks, then once every 24 hours prn pain.   metoprolol  succinate (TOPROL -XL) 25 MG 24 hr tablet Take 1 tablet (25 mg total) by mouth daily.   midodrine  (PROAMATINE ) 2.5 MG tablet Take 1 tablet (2.5 mg total) by mouth 3 (three) times a day with meals.   mirtazapine (REMERON) 30 MG tablet Take 30 mg by mouth at bedtime.   omeprazole  (PRILOSEC) 40 MG capsule Take 1 capsule (40 mg total) by mouth daily.   rizatriptan  (MAXALT ) 10 MG tablet TAKE 1 TABLET BY MOUTH AS NEEDED FOR MIGRAINE. MAY REPEAT IN 2 HOURS IF NEEDED   [DISCONTINUED] metFORMIN  (GLUCOPHAGE -XR) 500 MG 24 hr tablet Take 4 tablets (2,000 mg total) by mouth every morning.   [DISCONTINUED] ondansetron  (ZOFRAN -ODT) 8 MG disintegrating tablet Take 1 tablet (8 mg total) by  mouth every 8 (eight) hours as needed.   [DISCONTINUED] oseltamivir  (TAMIFLU ) 75 MG capsule Take 1 capsule (75 mg total) by mouth 2 (two) times daily.   No facility-administered medications prior to visit.    Review of Systems  All other systems reviewed and are negative.         Objective:     BP 104/74   Pulse 75   Ht 5\' 11"  (1.803 m)   Wt 206 lb (93.4 kg)   SpO2 98%   BMI 28.73 kg/m  BP Readings from Last 3 Encounters:  07/26/23 104/74  04/22/23 117/65  06/28/22 124/86   Wt Readings from Last 3 Encounters:  07/26/23 206 lb (93.4 kg)  04/22/23 210 lb (95.3 kg)  06/28/22 225 lb (102.1 kg)      Physical Exam   BP 104/74   Pulse 75   Ht 5\' 11"  (1.803 m)   Wt 206 lb (93.4 kg)   SpO2 98%   BMI 28.73 kg/m   General Appearance:    Alert, cooperative, no distress, appears stated age  Head:    Normocephalic, without obvious abnormality, atraumatic  Eyes:    PERRL, conjunctiva/corneas clear, EOM's intact, fundi    benign, both eyes       Ears:    Normal TM's and external ear canals, both ears  Nose:    Nares normal, septum midline, mucosa normal, no drainage    or sinus tenderness  Throat:   Lips, mucosa, and tongue normal; teeth and gums normal  Neck:   Supple, symmetrical, trachea midline, no adenopathy;       thyroid:  No enlargement/tenderness/nodules; no carotid   bruit or JVD  Back:     Symmetric, no curvature, ROM normal, no CVA tenderness  Lungs:     Clear to auscultation bilaterally, respirations unlabored  Chest wall:    No tenderness or deformity  Heart:    Regular rate and rhythm, S1 and S2 normal, no murmur, rub   or gallop  Abdomen:     Soft, non-tender, bowel sounds active all four quadrants,    no masses, no organomegaly        Extremities:   Extremities normal, atraumatic, no cyanosis or edema  Pulses:   2+ and symmetric all extremities  Skin:   Skin color, texture, turgor normal, no rashes or lesions  Lymph nodes:   Cervical, supraclavicular, and axillary nodes normal  Neurologic:   CNII-XII intact. Normal strength, sensation and reflexes      throughout       Assessment & Plan:    Routine Health Maintenance and Physical Exam  Immunization History  Administered Date(s) Administered   DTaP 03/04/1995, 05/01/1995, 07/01/1995, 06/30/1996, 12/07/1999, 12/08/2003   HIB (PRP-OMP) 03/04/1995, 05/01/1995, 07/01/1995, 04/08/1996   HIB (PRP-T) 03/04/1995, 05/01/1995, 07/01/1995, 04/08/1996   HIB, Unspecified 03/04/1995, 05/01/1995, 07/01/1995, 04/08/1996   Hepatitis B, PED/ADOLESCENT 11/03/94, 03/04/1995, 07/01/1995   IPV 03/04/1995, 05/01/1995, 06/30/1996, 12/07/1999, 12/08/2003   Influenza Split 03/07/2011   Influenza,inj,Quad PF,6+ Mos 12/17/2017, 11/20/2018, 01/27/2020, 01/12/2021, 12/15/2021   Influenza-Unspecified 03/07/2011, 03/31/2012   MMR 12/31/1995, 12/07/1999, 12/08/2003   Meningococcal Conjugate 10/20/2010   PFIZER(Purple Top)SARS-COV-2 Vaccination 06/12/2019, 07/13/2019   PPD Test 03/31/2012   Pfizer(Comirnaty)Fall Seasonal Vaccine 12 years and older  07/26/2023   Tdap 10/31/2006, 05/18/2014, 12/05/2017   Varicella 12/31/1995    Health Maintenance  Topic Date Due   Hepatitis C Screening  07/25/2024 (Originally 12/26/2012)   HIV Screening  07/25/2024 (Originally 12/26/2009)   INFLUENZA  VACCINE  10/18/2023   COVID-19 Vaccine (4 - Pfizer risk 2024-25 season) 01/26/2024   DTaP/Tdap/Td (9 - Td or Tdap) 12/06/2027   HPV VACCINES  Aged Out   Meningococcal B Vaccine  Aged Out    Discussed health benefits of physical activity, and encouraged him to engage in regular exercise appropriate for his age and condition.  Aaron AasElana Grayer was seen today for annual exam.  Diagnoses and all orders for this visit:  Routine physical examination -     CBC with Differential/Platelet -     CMP14+EGFR -     Lipid panel -     TSH -     VITAMIN D 25 Hydroxy (Vit-D Deficiency, Fractures) -     Valproic Acid level -     Ambulatory referral to Urology  Immunization due -     Pfizer Comirnaty Covid -19 Vaccine 6yrs and older  Schizoaffective disorder, bipolar type (HCC)  POTS (postural orthostatic tachycardia syndrome)  Vasectomy evaluation -     Ambulatory referral to Urology  Medication management -     CBC with Differential/Platelet -     CMP14+EGFR -     Lipid panel -     TSH -     VITAMIN D 25 Hydroxy (Vit-D Deficiency, Fractures) -     Valproic Acid level  Vitamin D deficiency -     VITAMIN D 25 Hydroxy (Vit-D Deficiency, Fractures)  Screening for lipid disorders -     Lipid panel  Screening for diabetes mellitus -     CMP14+EGFR  Insulin resistance -     metFORMIN  (GLUCOPHAGE -XR) 500 MG 24 hr tablet; Take 4 tablets (2,000 mg total) by mouth every morning.   Aaron Aas.Start a regular exercise program and make sure you are eating a healthy diet Try to eat 4 servings of dairy a day or take a calcium supplement (500mg  twice a day). Your vaccines are up to date.  Covid booster given today Vitals looks great.  Mood good-followed and managed  by [facility] Fasting labs ordered Urology referral placed.    Return in about 1 year (around 07/25/2024).     Shellee Streng, PA-C

## 2023-08-01 ENCOUNTER — Other Ambulatory Visit: Payer: Self-pay

## 2023-08-01 ENCOUNTER — Ambulatory Visit: Payer: Self-pay | Admitting: Physician Assistant

## 2023-08-01 DIAGNOSIS — Z79899 Other long term (current) drug therapy: Secondary | ICD-10-CM

## 2023-08-01 DIAGNOSIS — Z13220 Encounter for screening for lipoid disorders: Secondary | ICD-10-CM

## 2023-08-01 DIAGNOSIS — Z131 Encounter for screening for diabetes mellitus: Secondary | ICD-10-CM

## 2023-08-01 DIAGNOSIS — E559 Vitamin D deficiency, unspecified: Secondary | ICD-10-CM

## 2023-08-01 DIAGNOSIS — Z0000 Encounter for general adult medical examination without abnormal findings: Secondary | ICD-10-CM

## 2023-08-01 NOTE — Progress Notes (Signed)
 Let patient and [place] know. If [place] can I would still like to have labs missing redrawn. Can you reorder for him.

## 2023-08-02 LAB — CMP14+EGFR

## 2023-08-02 LAB — VITAMIN D 25 HYDROXY (VIT D DEFICIENCY, FRACTURES)

## 2023-08-02 LAB — CBC WITH DIFFERENTIAL/PLATELET
Basophils Absolute: 0 10*3/uL (ref 0.0–0.2)
Basos: 0 %
EOS (ABSOLUTE): 0 10*3/uL (ref 0.0–0.4)
Eos: 0 %
Hematocrit: 46.5 % (ref 37.5–51.0)
Hemoglobin: 15.5 g/dL (ref 13.0–17.7)
Immature Grans (Abs): 0 10*3/uL (ref 0.0–0.1)
Immature Granulocytes: 0 %
Lymphocytes Absolute: 1.9 10*3/uL (ref 0.7–3.1)
Lymphs: 24 %
MCH: 30.5 pg (ref 26.6–33.0)
MCHC: 33.3 g/dL (ref 31.5–35.7)
MCV: 92 fL (ref 79–97)
Monocytes Absolute: 0.6 10*3/uL (ref 0.1–0.9)
Monocytes: 8 %
Neutrophils Absolute: 5.3 10*3/uL (ref 1.4–7.0)
Neutrophils: 68 %
Platelets: 157 10*3/uL (ref 150–450)
RBC: 5.08 x10E6/uL (ref 4.14–5.80)
RDW: 13.8 % (ref 11.6–15.4)
WBC: 7.8 10*3/uL (ref 3.4–10.8)

## 2023-08-02 LAB — LIPID PANEL

## 2023-08-02 LAB — TSH

## 2023-08-02 LAB — VALPROIC ACID LEVEL: Valproic Acid Lvl: 74 ug/mL (ref 50–100)

## 2023-08-03 LAB — CMP14+EGFR
ALT: 16 IU/L (ref 0–44)
AST: 20 IU/L (ref 0–40)
Albumin: 4.5 g/dL (ref 4.3–5.2)
Alkaline Phosphatase: 74 IU/L (ref 44–121)
BUN/Creatinine Ratio: 11 (ref 9–20)
BUN: 10 mg/dL (ref 6–20)
Bilirubin Total: 0.5 mg/dL (ref 0.0–1.2)
CO2: 17 mmol/L — ABNORMAL LOW (ref 20–29)
Calcium: 9.2 mg/dL (ref 8.7–10.2)
Chloride: 97 mmol/L (ref 96–106)
Creatinine, Ser: 0.87 mg/dL (ref 0.76–1.27)
Globulin, Total: 2.1 g/dL (ref 1.5–4.5)
Glucose: 78 mg/dL (ref 70–99)
Potassium: 4.1 mmol/L (ref 3.5–5.2)
Sodium: 134 mmol/L (ref 134–144)
Total Protein: 6.6 g/dL (ref 6.0–8.5)
eGFR: 121 mL/min/{1.73_m2} (ref >59)

## 2023-08-03 LAB — CBC WITH DIFFERENTIAL/PLATELET
Basophils Absolute: 0 10*3/uL (ref 0.0–0.2)
Basos: 0 %
EOS (ABSOLUTE): 0 10*3/uL (ref 0.0–0.4)
Eos: 0 %
Hematocrit: 42.5 % (ref 37.5–51.0)
Hemoglobin: 14.2 g/dL (ref 13.0–17.7)
Immature Grans (Abs): 0 10*3/uL (ref 0.0–0.1)
Immature Granulocytes: 1 %
Lymphocytes Absolute: 2.4 10*3/uL (ref 0.7–3.1)
Lymphs: 36 %
MCH: 30.5 pg (ref 26.6–33.0)
MCHC: 33.4 g/dL (ref 31.5–35.7)
MCV: 91 fL (ref 79–97)
Monocytes Absolute: 0.5 10*3/uL (ref 0.1–0.9)
Monocytes: 7 %
Neutrophils Absolute: 3.7 10*3/uL (ref 1.4–7.0)
Neutrophils: 56 %
Platelets: 173 10*3/uL (ref 150–450)
RBC: 4.66 x10E6/uL (ref 4.14–5.80)
RDW: 14.2 % (ref 11.6–15.4)
WBC: 6.6 10*3/uL (ref 3.4–10.8)

## 2023-08-03 LAB — VITAMIN D 25 HYDROXY (VIT D DEFICIENCY, FRACTURES): Vit D, 25-Hydroxy: 28.1 ng/mL — ABNORMAL LOW (ref 30.0–100.0)

## 2023-08-03 LAB — LIPID PANEL
Chol/HDL Ratio: 5.6 ratio — ABNORMAL HIGH (ref 0.0–5.0)
Cholesterol, Total: 196 mg/dL (ref 100–199)
HDL: 35 mg/dL — ABNORMAL LOW (ref >39)
LDL Chol Calc (NIH): 135 mg/dL — ABNORMAL HIGH (ref 0–99)
Triglycerides: 144 mg/dL (ref 0–149)
VLDL Cholesterol Cal: 26 mg/dL (ref 5–40)

## 2023-08-03 LAB — TSH: TSH: 3.15 u[IU]/mL (ref 0.450–4.500)

## 2023-08-05 ENCOUNTER — Ambulatory Visit: Payer: Self-pay | Admitting: Physician Assistant

## 2023-08-05 NOTE — Progress Notes (Signed)
 Joshua Tyler,   Vitamin d  low. Make sure taking at least 1000-2000 units of D3 daily.  Kidney, liver, glucose look good.  TSH in normal range.  CBC looks good.  TG have improved LdL, bad cholesterol, not quite to optimal but in ok range.  HDL, good cholesterol, could be higher.  Keep working on exercise and low fat low sugar diet! Will continue to monitor.

## 2023-08-28 ENCOUNTER — Encounter: Payer: Self-pay | Admitting: Physician Assistant

## 2023-08-28 DIAGNOSIS — Z79899 Other long term (current) drug therapy: Secondary | ICD-10-CM

## 2023-08-28 DIAGNOSIS — Z0000 Encounter for general adult medical examination without abnormal findings: Secondary | ICD-10-CM

## 2023-08-31 LAB — CBC WITH DIFFERENTIAL/PLATELET
Basophils Absolute: 0 10*3/uL (ref 0.0–0.2)
Basos: 0 %
EOS (ABSOLUTE): 0 10*3/uL (ref 0.0–0.4)
Eos: 0 %
Hematocrit: 44.8 % (ref 37.5–51.0)
Hemoglobin: 15.1 g/dL (ref 13.0–17.7)
Immature Grans (Abs): 0 10*3/uL (ref 0.0–0.1)
Immature Granulocytes: 0 %
Lymphocytes Absolute: 2.5 10*3/uL (ref 0.7–3.1)
Lymphs: 31 %
MCH: 30.7 pg (ref 26.6–33.0)
MCHC: 33.7 g/dL (ref 31.5–35.7)
MCV: 91 fL (ref 79–97)
Monocytes Absolute: 0.6 10*3/uL (ref 0.1–0.9)
Monocytes: 7 %
Neutrophils Absolute: 5.1 10*3/uL (ref 1.4–7.0)
Neutrophils: 61 %
Platelets: 172 10*3/uL (ref 150–450)
RBC: 4.92 x10E6/uL (ref 4.14–5.80)
RDW: 13.8 % (ref 11.6–15.4)
WBC: 8.3 10*3/uL (ref 3.4–10.8)

## 2023-09-02 ENCOUNTER — Ambulatory Visit: Payer: Self-pay | Admitting: Physician Assistant

## 2023-09-02 NOTE — Progress Notes (Signed)
 Sent to [facility].

## 2023-09-06 ENCOUNTER — Encounter: Payer: Self-pay | Admitting: Physician Assistant

## 2023-09-06 DIAGNOSIS — Z79899 Other long term (current) drug therapy: Secondary | ICD-10-CM

## 2023-09-09 ENCOUNTER — Encounter: Payer: Self-pay | Admitting: Physician Assistant

## 2023-09-25 ENCOUNTER — Encounter: Payer: Self-pay | Admitting: Physician Assistant

## 2023-09-25 DIAGNOSIS — Z79899 Other long term (current) drug therapy: Secondary | ICD-10-CM

## 2023-09-28 LAB — CBC WITH DIFFERENTIAL/PLATELET
Basophils Absolute: 0 x10E3/uL (ref 0.0–0.2)
Basos: 1 %
EOS (ABSOLUTE): 0.1 x10E3/uL (ref 0.0–0.4)
Eos: 2 %
Hematocrit: 45.7 % (ref 37.5–51.0)
Hemoglobin: 15.5 g/dL (ref 13.0–17.7)
Immature Grans (Abs): 0 x10E3/uL (ref 0.0–0.1)
Immature Granulocytes: 0 %
Lymphocytes Absolute: 2.1 x10E3/uL (ref 0.7–3.1)
Lymphs: 36 %
MCH: 30.9 pg (ref 26.6–33.0)
MCHC: 33.9 g/dL (ref 31.5–35.7)
MCV: 91 fL (ref 79–97)
Monocytes Absolute: 0.4 x10E3/uL (ref 0.1–0.9)
Monocytes: 6 %
Neutrophils Absolute: 3.2 x10E3/uL (ref 1.4–7.0)
Neutrophils: 54 %
Platelets: 172 x10E3/uL (ref 150–450)
RBC: 5.01 x10E6/uL (ref 4.14–5.80)
RDW: 13 % (ref 11.6–15.4)
WBC: 5.8 x10E3/uL (ref 3.4–10.8)

## 2023-09-30 ENCOUNTER — Ambulatory Visit: Payer: Self-pay | Admitting: Physician Assistant

## 2023-09-30 ENCOUNTER — Other Ambulatory Visit: Payer: Self-pay

## 2023-09-30 ENCOUNTER — Encounter: Payer: Self-pay | Admitting: Physician Assistant

## 2023-09-30 DIAGNOSIS — Z79899 Other long term (current) drug therapy: Secondary | ICD-10-CM

## 2023-09-30 NOTE — Progress Notes (Signed)
 Please send to [facility]. Make sure his next cbc is ordered for 4 weeks.

## 2023-09-30 NOTE — Telephone Encounter (Signed)
 Yes but I think we should try to combine with CBC.

## 2023-10-02 LAB — CBC WITH DIFFERENTIAL/PLATELET
Basophils Absolute: 0 x10E3/uL (ref 0.0–0.2)
Basos: 0 %
EOS (ABSOLUTE): 0 x10E3/uL (ref 0.0–0.4)
Eos: 0 %
Hematocrit: 43.7 % (ref 37.5–51.0)
Hemoglobin: 14.4 g/dL (ref 13.0–17.7)
Immature Grans (Abs): 0 x10E3/uL (ref 0.0–0.1)
Immature Granulocytes: 0 %
Lymphocytes Absolute: 2.1 x10E3/uL (ref 0.7–3.1)
Lymphs: 40 %
MCH: 30.1 pg (ref 26.6–33.0)
MCHC: 33 g/dL (ref 31.5–35.7)
MCV: 91 fL (ref 79–97)
Monocytes Absolute: 0.4 x10E3/uL (ref 0.1–0.9)
Monocytes: 8 %
Neutrophils Absolute: 2.8 x10E3/uL (ref 1.4–7.0)
Neutrophils: 51 %
Platelets: 155 x10E3/uL (ref 150–450)
RBC: 4.79 x10E6/uL (ref 4.14–5.80)
RDW: 13.5 % (ref 11.6–15.4)
WBC: 5.3 x10E3/uL (ref 3.4–10.8)

## 2023-10-03 ENCOUNTER — Ambulatory Visit: Payer: Self-pay | Admitting: Physician Assistant

## 2023-10-03 LAB — VALPROIC ACID LEVEL: Valproic Acid Lvl: 72 ug/mL (ref 50–100)

## 2023-10-03 NOTE — Progress Notes (Signed)
 Send to [facility].

## 2023-10-13 ENCOUNTER — Other Ambulatory Visit: Payer: Self-pay | Admitting: Physician Assistant

## 2023-10-13 DIAGNOSIS — G44201 Tension-type headache, unspecified, intractable: Secondary | ICD-10-CM

## 2023-10-31 ENCOUNTER — Other Ambulatory Visit: Payer: Self-pay

## 2023-10-31 ENCOUNTER — Encounter: Payer: Self-pay | Admitting: Physician Assistant

## 2023-10-31 DIAGNOSIS — Z79899 Other long term (current) drug therapy: Secondary | ICD-10-CM

## 2023-11-07 LAB — CBC WITH DIFFERENTIAL/PLATELET
Basophils Absolute: 0 x10E3/uL (ref 0.0–0.2)
Basos: 0 %
EOS (ABSOLUTE): 0 x10E3/uL (ref 0.0–0.4)
Eos: 0 %
Hematocrit: 47.1 % (ref 37.5–51.0)
Hemoglobin: 15.9 g/dL (ref 13.0–17.7)
Immature Grans (Abs): 0 x10E3/uL (ref 0.0–0.1)
Immature Granulocytes: 0 %
Lymphocytes Absolute: 2.4 x10E3/uL (ref 0.7–3.1)
Lymphs: 36 %
MCH: 31 pg (ref 26.6–33.0)
MCHC: 33.8 g/dL (ref 31.5–35.7)
MCV: 92 fL (ref 79–97)
Monocytes Absolute: 0.5 x10E3/uL (ref 0.1–0.9)
Monocytes: 7 %
Neutrophils Absolute: 3.7 x10E3/uL (ref 1.4–7.0)
Neutrophils: 57 %
Platelets: 179 x10E3/uL (ref 150–450)
RBC: 5.13 x10E6/uL (ref 4.14–5.80)
RDW: 13.6 % (ref 11.6–15.4)
WBC: 6.7 x10E3/uL (ref 3.4–10.8)

## 2023-11-08 NOTE — Progress Notes (Signed)
 Please send to [facility] department.

## 2023-11-19 ENCOUNTER — Encounter: Payer: Self-pay | Admitting: Sports Medicine

## 2023-12-16 ENCOUNTER — Other Ambulatory Visit: Payer: Self-pay | Admitting: Physician Assistant

## 2023-12-16 DIAGNOSIS — K580 Irritable bowel syndrome with diarrhea: Secondary | ICD-10-CM

## 2023-12-17 ENCOUNTER — Encounter: Payer: Self-pay | Admitting: Physician Assistant

## 2023-12-17 DIAGNOSIS — Z79899 Other long term (current) drug therapy: Secondary | ICD-10-CM

## 2023-12-30 ENCOUNTER — Ambulatory Visit (INDEPENDENT_AMBULATORY_CARE_PROVIDER_SITE_OTHER): Payer: PRIVATE HEALTH INSURANCE

## 2023-12-30 VITALS — Temp 98.1°F

## 2023-12-30 DIAGNOSIS — Z23 Encounter for immunization: Secondary | ICD-10-CM | POA: Diagnosis not present

## 2024-01-01 ENCOUNTER — Ambulatory Visit: Payer: Self-pay | Admitting: Physician Assistant

## 2024-01-01 LAB — CMP14+EGFR
ALT: 26 IU/L (ref 0–44)
AST: 21 IU/L (ref 0–40)
Albumin: 4.5 g/dL (ref 4.3–5.2)
Alkaline Phosphatase: 81 IU/L (ref 47–123)
BUN/Creatinine Ratio: 18 (ref 9–20)
BUN: 16 mg/dL (ref 6–20)
Bilirubin Total: 0.5 mg/dL (ref 0.0–1.2)
CO2: 21 mmol/L (ref 20–29)
Calcium: 9.3 mg/dL (ref 8.7–10.2)
Chloride: 101 mmol/L (ref 96–106)
Creatinine, Ser: 0.88 mg/dL (ref 0.76–1.27)
Globulin, Total: 2.1 g/dL (ref 1.5–4.5)
Glucose: 86 mg/dL (ref 70–99)
Potassium: 4.5 mmol/L (ref 3.5–5.2)
Sodium: 137 mmol/L (ref 134–144)
Total Protein: 6.6 g/dL (ref 6.0–8.5)
eGFR: 119 mL/min/1.73 (ref >59)

## 2024-01-01 LAB — CBC WITH DIFFERENTIAL/PLATELET
Basophils Absolute: 0 x10E3/uL (ref 0.0–0.2)
Basos: 0 %
EOS (ABSOLUTE): 0.1 x10E3/uL (ref 0.0–0.4)
Eos: 2 %
Hematocrit: 44.7 % (ref 37.5–51.0)
Hemoglobin: 15.3 g/dL (ref 13.0–17.7)
Immature Grans (Abs): 0.1 x10E3/uL (ref 0.0–0.1)
Immature Granulocytes: 1 %
Lymphocytes Absolute: 2.1 x10E3/uL (ref 0.7–3.1)
Lymphs: 31 %
MCH: 31.2 pg (ref 26.6–33.0)
MCHC: 34.2 g/dL (ref 31.5–35.7)
MCV: 91 fL (ref 79–97)
Monocytes Absolute: 0.5 x10E3/uL (ref 0.1–0.9)
Monocytes: 7 %
Neutrophils Absolute: 4 x10E3/uL (ref 1.4–7.0)
Neutrophils: 59 %
Platelets: 168 x10E3/uL (ref 150–450)
RBC: 4.91 x10E6/uL (ref 4.14–5.80)
RDW: 13.8 % (ref 11.6–15.4)
WBC: 6.8 x10E3/uL (ref 3.4–10.8)

## 2024-01-01 LAB — CLOZAPINE (CLOZARIL)
Clozapine Lvl: 480 ng/mL (ref 350–600)
NorClozapine: 120 ng/mL
Total(Cloz+Norcloz): 600 ng/mL

## 2024-01-01 LAB — HEMOGLOBIN A1C
Est. average glucose Bld gHb Est-mCnc: 85 mg/dL
Hgb A1c MFr Bld: 4.6 % — ABNORMAL LOW (ref 4.8–5.6)

## 2024-01-01 NOTE — Progress Notes (Signed)
LMVM for patient to return call. 

## 2024-01-01 NOTE — Telephone Encounter (Signed)
 Copied from [id number] [id number]. Topic: Clinical - Lab/Test Results >> Jan 01, 2024  2:33 PM Larissa S wrote: Reason for CRM: Patient returning missed call for lab results. Relayed results per provider note, no additional questions at this time.

## 2024-01-01 NOTE — Progress Notes (Signed)
 Labs faxed to Dr. Sanders at [phone]

## 2024-01-01 NOTE — Progress Notes (Signed)
 A1C looks great.  No concerns.  Please send to [facility].

## 2024-02-03 ENCOUNTER — Other Ambulatory Visit: Payer: Self-pay | Admitting: Physician Assistant

## 2024-02-03 DIAGNOSIS — E88819 Insulin resistance, unspecified: Secondary | ICD-10-CM

## 2024-02-26 ENCOUNTER — Encounter: Payer: Self-pay | Admitting: Physician Assistant

## 2024-02-26 DIAGNOSIS — Z79899 Other long term (current) drug therapy: Secondary | ICD-10-CM

## 2024-02-28 ENCOUNTER — Ambulatory Visit: Payer: Self-pay | Admitting: Physician Assistant

## 2024-02-28 LAB — CBC WITH DIFFERENTIAL/PLATELET
Basophils Absolute: 0 x10E3/uL (ref 0.0–0.2)
Basos: 1 %
EOS (ABSOLUTE): 0 x10E3/uL (ref 0.0–0.4)
Eos: 0 %
Hematocrit: 45.6 % (ref 37.5–51.0)
Hemoglobin: 15.8 g/dL (ref 13.0–17.7)
Immature Grans (Abs): 0 x10E3/uL (ref 0.0–0.1)
Immature Granulocytes: 0 %
Lymphocytes Absolute: 2.3 x10E3/uL (ref 0.7–3.1)
Lymphs: 36 %
MCH: 31.5 pg (ref 26.6–33.0)
MCHC: 34.6 g/dL (ref 31.5–35.7)
MCV: 91 fL (ref 79–97)
Monocytes Absolute: 0.4 x10E3/uL (ref 0.1–0.9)
Monocytes: 5 %
Neutrophils Absolute: 3.8 x10E3/uL (ref 1.4–7.0)
Neutrophils: 57 %
Platelets: 172 x10E3/uL (ref 150–450)
RBC: 5.02 x10E6/uL (ref 4.14–5.80)
RDW: 13.5 % (ref 11.6–15.4)
WBC: 6.5 x10E3/uL (ref 3.4–10.8)

## 2024-02-28 NOTE — Progress Notes (Signed)
 Normal CBC. Send to [facility].

## 2024-03-31 ENCOUNTER — Encounter: Payer: Self-pay | Admitting: Physician Assistant

## 2024-03-31 DIAGNOSIS — F250 Schizoaffective disorder, bipolar type: Secondary | ICD-10-CM

## 2024-03-31 NOTE — Telephone Encounter (Signed)
 Ok for depakote level as well.

## 2024-04-02 LAB — VALPROIC ACID LEVEL: Valproic Acid Lvl: 57 ug/mL (ref 50–100)

## 2024-04-03 ENCOUNTER — Ambulatory Visit: Payer: Self-pay | Admitting: Physician Assistant

## 2024-04-03 NOTE — Progress Notes (Signed)
 Last read by Rankin Bonds at 10:36AM on 04/01/2024.

## 2024-04-03 NOTE — Progress Notes (Signed)
 Send to [facility].

## 2024-04-03 NOTE — Progress Notes (Signed)
 Results faxed to [facility]  at fax # [phone]

## 2024-07-27 ENCOUNTER — Encounter: Payer: PRIVATE HEALTH INSURANCE | Admitting: Physician Assistant
